# Patient Record
Sex: Female | Born: 1970 | Race: White | Hispanic: No | Marital: Married | Smoking: Never smoker
Health system: Southern US, Community
[De-identification: ages and names within clinical notes are randomized; demographics above are authoritative.]

## PROBLEM LIST (undated history)

## (undated) DIAGNOSIS — T7840XA Allergy, unspecified, initial encounter: Secondary | ICD-10-CM

## (undated) DIAGNOSIS — E079 Disorder of thyroid, unspecified: Secondary | ICD-10-CM

## (undated) DIAGNOSIS — Z9889 Other specified postprocedural states: Secondary | ICD-10-CM

## (undated) DIAGNOSIS — E785 Hyperlipidemia, unspecified: Secondary | ICD-10-CM

## (undated) DIAGNOSIS — D649 Anemia, unspecified: Secondary | ICD-10-CM

## (undated) DIAGNOSIS — E039 Hypothyroidism, unspecified: Secondary | ICD-10-CM

## (undated) DIAGNOSIS — Z5189 Encounter for other specified aftercare: Secondary | ICD-10-CM

## (undated) DIAGNOSIS — R112 Nausea with vomiting, unspecified: Secondary | ICD-10-CM

## (undated) HISTORY — DX: Encounter for other specified aftercare: Z51.89

## (undated) HISTORY — DX: Allergy, unspecified, initial encounter: T78.40XA

## (undated) HISTORY — DX: Hyperlipidemia, unspecified: E78.5

## (undated) HISTORY — DX: Disorder of thyroid, unspecified: E07.9

## (undated) HISTORY — DX: Anemia, unspecified: D64.9

## (undated) HISTORY — PX: MANDIBLE SURGERY: SHX707

---
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
## 2006-02-06 HISTORY — PX: TUBAL LIGATION: SHX77

## 2006-11-26 ENCOUNTER — Inpatient Hospital Stay (HOSPITAL_COMMUNITY): Admission: RE | Admit: 2006-11-26 | Discharge: 2006-11-29 | Payer: Self-pay | Admitting: Obstetrics and Gynecology

## 2006-11-26 ENCOUNTER — Encounter (INDEPENDENT_AMBULATORY_CARE_PROVIDER_SITE_OTHER): Payer: Self-pay | Admitting: Obstetrics and Gynecology

## 2006-12-04 ENCOUNTER — Ambulatory Visit: Admission: RE | Admit: 2006-12-04 | Discharge: 2006-12-04 | Payer: Self-pay | Admitting: Obstetrics and Gynecology

## 2008-01-07 ENCOUNTER — Encounter: Admission: RE | Admit: 2008-01-07 | Discharge: 2008-01-07 | Payer: Self-pay | Admitting: Family Medicine

## 2008-02-07 HISTORY — PX: OTHER SURGICAL HISTORY: SHX169

## 2009-03-30 ENCOUNTER — Ambulatory Visit (HOSPITAL_COMMUNITY): Admission: RE | Admit: 2009-03-30 | Discharge: 2009-03-30 | Payer: Self-pay | Admitting: Obstetrics and Gynecology

## 2010-04-27 LAB — URINALYSIS, ROUTINE W REFLEX MICROSCOPIC
Protein, ur: NEGATIVE mg/dL
Specific Gravity, Urine: <1.005 — ABNORMAL LOW (ref 1.005–1.030)
Urobilinogen, UA: 0.2 mg/dL (ref 0.0–1.0)

## 2010-04-27 LAB — CBC
HCT: 34.8 % — ABNORMAL LOW (ref 36.0–46.0)
MCV: 82.8 fL (ref 78.0–100.0)
Platelets: 151 10*3/uL (ref 150–400)
RBC: 4.21 MIL/uL (ref 3.87–5.11)
WBC: 5.5 10*3/uL (ref 4.0–10.5)

## 2010-04-27 LAB — TSH: TSH: 5.581 u[IU]/mL — ABNORMAL HIGH (ref 0.350–4.500)

## 2010-06-21 NOTE — Op Note (Signed)
NAMELAURALIE, BLACKSHER               ACCOUNT NO.:  [id number]   MEDICAL RECORD NO.:  [id number]          PATIENT TYPE:  INP   LOCATION:  9128                          FACILITY:  [facility]   PHYSICIAN:  Randye Lobo, M.D.   DATE OF BIRTH:  [birth year]   DATE OF PROCEDURE:  DATE OF DISCHARGE:                               OPERATIVE REPORT   PREOPERATIVE DIAGNOSES:  1. Intrauterine gestation at 38.5 weeks.  2. A complete placenta previa.  3. Desire for permanent sterilization.   POSTOPERATIVE DIAGNOSES:  1. Intrauterine gestation at 38.5 weeks.  2. A complete placenta previa.  3. Desire for permanent sterilization.   PROCEDURE:  Primary low segment transverse cesarean section, bilateral  tubal ligation by modified Pomeroy technique.   SURGEON:  Conley Simmonds, MD   ASSISTANT:  Lodema Hong, MD   ANESTHESIA:  Spinal.   IV FLUIDS:  4400 mL Ringer's lactate.   ESTIMATED BLOOD LOSS:  1200 mL.   URINE OUTPUT:  200 mL   COMPLICATIONS:  None.   INDICATIONS FOR PROCEDURE:  The patient is a 40 year old gravida 2, para  1-0-0-1 Caucasian female at 64.5 weeks' gestation who has a known  complete posterior placenta previa who now presents at term for a  scheduled cesarean section.  The patient's placenta previa has been  asymptomatic throughout her pregnancy.  The patient also requests  permanent sterilization at the time of her cesarean section.  A plan is  now made to proceed after risks, benefits, and alternatives are  reviewed.  The patient understands that there is a failure rate of the  tubal ligation of approximately 1 in 250 to 1 in 300 which may result in  either an intrauterine or an ectopic pregnancy.   FINDINGS:  A viable female was delivered at 1310 with Apgars of 9 at one  minute and 5 minutes.  The weight was 8 pounds 4 ounces.  The placenta  was noted to also be adherent to the anterior lower uterine segment.  There was a marginal insertion of a three-vessel cord.  The  uterus,  tubes and ovaries were unremarkable.   SPECIMENS:  A portion of the right and left fallopian tubes were sent to  pathology.  The placenta was also sent to pathology.   PROCEDURE:  The patient was reidentified in the preoperative hold area.  She did receive Ancef 1 gram IV for antibiotic prophylaxis.  In the  operating room, spinal anesthetic was administered.  The patient was  then placed in the supine position with a left lateral tilt.  The  abdomen was sterilely prepped and a Foley catheter was sterilely placed  inside the bladder.  She was then sterilely draped.   A Pfannenstiel incision was created sharply with a scalpel.  The  incision was carried down to the fascia using monopolar cautery for  hemostasis.  The fascia was then incised in the midline and the incision  was extended bilaterally with Mayo scissors.  The rectus muscles were  separated from the fascia superiorly and inferiorly.  The rectus muscles  were then sharply divided in  the midline.  The parietal peritoneum was  entered sharply with a Metzenbaum scissors after it was elevated with  two hemostat clamps.  The peritoneal incision was extended cranially and  caudally.   The lower uterine segment was exposed with the bladder retractor.  A  bladder flap was then sharply created.  A transverse lower uterine  segment incision was created with a scalpel.  The placenta was  encountered.  The uterine cavity was ultimately entered bluntly with a  Kelly clamp and the uterine incision was extended bluntly as well.  Membranes ere ruptured with an Allis clamp.  Fundal pressure was applied  and the head had to be rotated into a vertex presentation as it was  noted to be off to the patient's left-hand side.  A Mityvac was used to  assist with delivery of the vertex.  There was a poor seal which was  appreciated with the first applications and therefore a new vacuum was  called for and the Mityvac was applied one final  and third time which  resulted in good seal and then effected delivery of the vertex.  There  was a nuchal cord which was reduced.  The nares and mouth were then  suctioned and the remainder of the newborn was delivered without  difficulty.  The cord was doubly clamped and cut the newborn was carried  over to the awaiting pediatricians.   The placenta was manually extracted at this time and it was sent for  cord blood donation.  The uterus was exteriorized at this time and it  was wiped clean with a moistened lap pad.  The uterine incision was  closed with a double layer closure of #1 chromic.  The first was a  running locked layer and the second was an imbricating layer.  There was  in the area of bleeding near the right apex of the uterine incision and  this responded to a figure-of-eight suture of #1 chromic.  There was  also some bleeding along the left inferior lower uterine segment where  there was a small nonexpanding hematoma.  Initially two figure-of-eight  sutures of 2-0 chromic were placed but this did not create hemostasis.  A simple suture of #1 chromic was therefore placed which created good  hemostasis.   The right fallopian tube was grasped with a Babcock clamp and was  followed all the way to its fimbriated end.  A free tie of 0 plain gut  suture was then used to create a loop of tissue along the isthmic  portion of the fallopian tube.  A hemostat clamp was then brought  through the mesosalpinx and a separate tie of 2-0 plain gut suture was  placed at the base of each and at the loop of tissue.  The intervening  portion of fallopian tube was then sharply excised and sent to  pathology.  Hemostasis was good.  The same procedure that was performed  on the right fallopian tube was then repeated on the left fallopian tube  after it was grasped and followed all the way to its fimbriated end.   The uterus was returned to the peritoneal cavity which was irrigated and   suctioned at this time.  The uterine incision was noted to be  hemostatic.   The abdomen was closed.  The parietal peritoneum was closed with a  running suture of 3-0 Vicryl.  The rectus muscles were reapproximated in  the midline with interrupted sutures of #1 chromic.  The fascia was  closed with a running suture of 0 Vicryl.  The subcutaneous layer was  irrigated and suctioned and made hemostatic with monopolar cautery.  The  skin was closed with staples and a sterile bandage was placed over this.   This concluded the patient's procedure.  There were no complications.  All needle, instrument, sponge counts were correct.  The patient was  escorted to the recovery room in stable and awake condition.      Randye Lobo, M.D.  Electronically Signed     BES/MEDQ  D:  11/26/2006  T:  11/27/2006  Job:  [id number]

## 2010-06-21 NOTE — Discharge Summary (Signed)
Jacqueline Ochoa, Jacqueline Ochoa               ACCOUNT NO.:  [id number]   MEDICAL RECORD NO.:  [id number]          PATIENT TYPE:  INP   LOCATION:  9128                          FACILITY:  [facility]   PHYSICIAN:  Gerrit Friends. Aldona Bar, M.D.   DATE OF BIRTH:  1971-01-21   DATE OF ADMISSION:  11/26/2006  DATE OF DISCHARGE:  11/29/2006                               DISCHARGE SUMMARY   DISCHARGE DIAGNOSES:  1. 38-week intrauterine pregnancy, delivered.  8 pound 4 ounces female      infant, Apgars 9 and 10.  2. Blood type A positive.  3. Complete placenta previa.  4. Desire for elective sterilization.   PROCEDURE:  Primary low transverse cesarean section and tubal  sterilization.   INDICATIONS FOR PROCEDURE:  This 40 year old gravida 2, para 1 presented  for admission on November 26, 2006 for a primary low transverse cesarean  section because of a complete placenta previa.  The patient has been  asymptomatic during her pregnancy and had a relatively benign pregnancy.  She also is desirous of a permanent elective sterilization procedure.   DESCRIPTION OF PROCEDURE:  She was taken to the operating room on  November 26, 2006 at which time she underwent a primary low transverse  cesarean section with delivery of an 8 pound 4 ounces female infant with  Apgars of 9 and 10.  A tubal sterilization procedure was likewise  carried out.  Her postpartum course was complicated by anemia.  On  November 27, 2006, her hemoglobin was 7.8 with a white count of 14,000  and a platelet count of 125,000.  On November 28, 2006, her hemoglobin  was 10.4 with a white count of 12,500 and a platelet count of 135,000.  On the morning of November 29, 2006, she was ambulating well, tolerating  a regular diet well, and was afebrile.  The wound was clean and dry.  Her breast feeding was going well.  She was tolerating a regular diet  well, and she was desirous of discharge.  Accordingly, she was given all  appropriate instructions per discharge  brochure and understood all  instructions well.  Her staples were removed and wound was Steri-  Stripped with Benzoin.   DISCHARGE MEDICATIONS:  Her prescriptions include Motrin 600 mg every 6  hours as needed for cramping or pain and Tylox 1-2 every 4-6 hours as  needed for more severe pain.  She will also continue her vitamins one a  day and use Feosol capsules daily as well.   FOLLOW UP:  Followup in the office will be arranged 4 weeks hence.   A discharge brochure was given to the patient at the time of discharge  and understood well by the patient.  Followup, as mentioned, 4 weeks  hence.   CONDITION ON DISCHARGE:  Improved.      Gerrit Friends. Aldona Bar, M.D.  Electronically Signed     RMW/MEDQ  D:  11/29/2006  T:  11/29/2006  Job:  [id number]

## 2010-11-16 LAB — TYPE AND SCREEN
ABO/RH(D): A POS
Antibody Screen: NEGATIVE

## 2010-11-16 LAB — CBC
MCHC: 35.1
MCV: 89.1
MCV: 89.7
Platelets: 125 — ABNORMAL LOW
Platelets: 135 — ABNORMAL LOW
Platelets: 138 — ABNORMAL LOW
RBC: 2.51 — ABNORMAL LOW
RBC: 3.47 — ABNORMAL LOW
RDW: 15.1 — ABNORMAL HIGH
WBC: 12.5 — ABNORMAL HIGH
WBC: 8.3

## 2010-11-16 LAB — URINALYSIS, ROUTINE W REFLEX MICROSCOPIC
Nitrite: NEGATIVE
Specific Gravity, Urine: 1.01
Urobilinogen, UA: 0.2
pH: 7

## 2010-11-16 LAB — RPR: RPR Ser Ql: NONREACTIVE

## 2011-05-12 ENCOUNTER — Other Ambulatory Visit (HOSPITAL_BASED_OUTPATIENT_CLINIC_OR_DEPARTMENT_OTHER): Payer: Self-pay | Admitting: Family Medicine

## 2011-05-12 ENCOUNTER — Ambulatory Visit (HOSPITAL_BASED_OUTPATIENT_CLINIC_OR_DEPARTMENT_OTHER)
Admission: RE | Admit: 2011-05-12 | Discharge: 2011-05-12 | Disposition: A | Discharge status: Home / Self Care | Payer: 59 | Source: Ambulatory Visit | Attending: Family Medicine | Admitting: Family Medicine

## 2011-05-12 DIAGNOSIS — R1031 Right lower quadrant pain: Secondary | ICD-10-CM | POA: Insufficient documentation

## 2011-05-12 DIAGNOSIS — R932 Abnormal findings on diagnostic imaging of liver and biliary tract: Secondary | ICD-10-CM | POA: Insufficient documentation

## 2011-05-12 MED ORDER — IOHEXOL 300 MG/ML  SOLN
100.0000 mL | Freq: Once | INTRAMUSCULAR | Status: AC | PRN
  Administered 2011-05-12: 100 mL via INTRAVENOUS

## 2011-05-22 ENCOUNTER — Encounter (INDEPENDENT_AMBULATORY_CARE_PROVIDER_SITE_OTHER): Payer: Self-pay | Admitting: Surgery

## 2011-06-13 ENCOUNTER — Ambulatory Visit (INDEPENDENT_AMBULATORY_CARE_PROVIDER_SITE_OTHER): Payer: 59 | Admitting: Surgery

## 2011-06-13 ENCOUNTER — Encounter (INDEPENDENT_AMBULATORY_CARE_PROVIDER_SITE_OTHER): Payer: Self-pay | Admitting: Surgery

## 2011-06-13 VITALS — BP 114/62 | HR 68 | Temp 99.1°F | Resp 14 | Ht 66.0 in | Wt 120.2 lb

## 2011-06-13 DIAGNOSIS — K5900 Constipation, unspecified: Secondary | ICD-10-CM | POA: Insufficient documentation

## 2011-06-13 DIAGNOSIS — R1031 Right lower quadrant pain: Secondary | ICD-10-CM

## 2011-06-13 NOTE — Progress Notes (Signed)
Patient ID: Jacqueline Ochoa, female   DOB: October 01, 1970, 41 y.o.   MRN: [id number]  Chief Complaint  Patient presents with  . Inguinal Hernia    new pt- eval RIH    HPI Jacqueline Ochoa is a 41 y.o. female.  Referred by Dr. Foy Guadalajara for possible right inguinal hernia HPI This is a 41 yo female in good health who presents with a 9 month history of vague right lower quadrant abdominal pain near her right iliac crest.  Initially, this pain radiated down into her upper thigh and around her back.  In November, she had at least one severe episode of pain associated with nausea and vomiting.  The pain at this time was localized to the RLQ.  This resolved after about a day, but recurred a few weeks ago.  She was on a long driving trip to [place] and had several fast-food meals that day. She had severe pain again at the same place associated with nausea and vomiting.  She reports daily bowel movements, but they are frequently hard and require significant straining.  She had a CT scan on 05/12/11 that showed a 7 mm gallstone with no signs of cholecystitis, normal CBD.  No hernias were identified.  The appendix was normal.  She also underwent GYN examination which was unremarkable.  She is now referred for surgical evaluation. Past Medical History  Diagnosis Date  . Hyperlipidemia   . Thyroid disease   . Vitamin d deficiency   . Allergy   . Anemia   . Blood transfusion 1984    Past Surgical History  Procedure Date  . Tonsillectomy and adenoidectomy 1984  . Cesarean section 2008  . Tubal ligation 2008  . Uterine ablation 2010    Family History  Problem Relation Age of Onset  . Heart disease Maternal Grandfather     Social History History  Substance Use Topics  . Smoking status: Never Smoker   . Smokeless tobacco: Not on file  . Alcohol Use: Yes     occ    Allergies  Allergen Reactions  . Zocor (Simvastatin - High Dose)     Current Outpatient Prescriptions  Medication Sig Dispense Refill    . Cholecalciferol (VITAMIN D PO) Take 80,000 mg by mouth once a week.      . fish oil-omega-3 fatty acids 1000 MG capsule Take 2 g by mouth daily.      Marland Kitchen ketotifen (ALAWAY) 0.025 % ophthalmic solution 1 drop 2 (two) times daily.      Marland Kitchen levothyroxine (SYNTHROID) 88 MCG tablet Take 88 mcg by mouth daily.      Marland Kitchen liothyronine (CYTOMEL) 5 MCG tablet Take 5 mcg by mouth 2 (two) times daily.       . mometasone (NASONEX) 50 MCG/ACT nasal spray Place 2 sprays into the nose as needed.       . Multiple Vitamins-Minerals (MULTIVITAMIN WITH MINERALS) tablet Take 1 tablet by mouth daily.      . Probiotic Product (PROBIOTIC FORMULA PO) Take by mouth daily. W/ lactose defense        Review of Systems Review of Systems  Constitutional: Positive for appetite change. Negative for fever, chills and unexpected weight change.  HENT: Negative for hearing loss, congestion, sore throat, trouble swallowing and voice change.   Eyes: Negative for visual disturbance.  Respiratory: Negative for cough and wheezing.   Cardiovascular: Negative for chest pain, palpitations and leg swelling.  Gastrointestinal: Positive for nausea, vomiting, abdominal pain and constipation.  Negative for diarrhea, blood in stool, abdominal distention and anal bleeding.  Genitourinary: Negative for hematuria, vaginal bleeding and difficulty urinating.  Musculoskeletal: Negative for arthralgias.  Skin: Negative for rash and wound.  Neurological: Negative for seizures, syncope and headaches.  Hematological: Negative for adenopathy. Does not bruise/bleed easily.  Psychiatric/Behavioral: Negative for confusion.    Blood pressure 114/62, pulse 68, temperature 99.1 F (37.3 C), temperature source Temporal, resp. rate 14, height 5\' 6"  (1.676 m), weight 120 lb 3.2 oz (54.522 kg).  Physical Exam Physical Exam WDWN in NAD - thin HEENT:  EOMI, sclera anicteric Neck:  No masses, no thyromegaly Lungs:  CTA bilaterally; normal respiratory  effort CV:  Regular rate and rhythm; no murmurs Abd:  +bowel sounds, soft, mild tenderness just medial to her right ASIS; Liver edge is palpable just above iliac crest Ext:  Well-perfused; no edema Skin:  Warm, dry; no sign of jaundice  Data Reviewed CT scan - abd/pelvis  My interpretation notices a large amount of formed stool in the cecum and ascending colon.  No sign of any hernias.    Official report  CT Abdomen Pelvis W Contrast   Status: Final result       PACS Images     Show images for CT Abdomen Pelvis W Contrast      Study Result     *RADIOLOGY REPORT*   Clinical Data: Recurrent right lower quadrant pain   CT ABDOMEN AND PELVIS WITH CONTRAST   Technique:  Multidetector CT imaging of the abdomen and pelvis was performed following the standard protocol during bolus administration of intravenous contrast.   Contrast: OMNIPAQUE IOHEXOL 300 MG/ML  SOLN   Comparison: None   Findings: The lung bases are clear.   No focal liver abnormalities identified.   Normal appearance of the spleen.   The adrenal glands are normal.   Pancreas is negative.   Normal appearance of the right kidney.  The left kidney is also normal.   Larina Bras is identified within the lumen of the gallbladder measuring up to 7 mm.   No secondary signs of acute cholecystitis.  The common bile duct has a normal caliber.  The pancreas is normal.   Normal appearance of the right kidney.  The left kidney is also normal.   There is no upper abdominal adenopathy identified.   There is no pelvic or inguinal adenopathy.  The uterus and the adnexal structures have a normal physiologic appearance.   The urinary bladder is also normal in appearance   The small bowel loops are unremarkable.  The appendix is identified and is within normal limits.  The colon is normal.   Review of the visualized osseous structures is unremarkable. The stomach appears normal.   IMPRESSION:   1.  No  acute findings within the abdomen or pelvis. 2.  The appendix is visualized and appears normal. 3.  Suspect gallstone.   Original Report Authenticated By: Rosealee Albee, M.D.      Assessment   1.  RLQ pain of unclear etiology - doubt GYN etiology 2.  Cholelithiasis - her liver is low-riding and near her iliac crest, so it is possible this pain is coming from her gallbladder.  The nausea/ vomiting and the relationship to fast food makes this suspicious. 3.  Constipation - significant distention of cecum and ascending noted on CT scan 4.  Chronic appendicitis - much less likely    Plan    Bowel regimen - BID stool softener/ PRN  miralax until she feels that her colon is empty.  Keep well-hydrated Observe diet for any relationship to her symptoms. HIDA scan is a possible future test to check for biliary dyskinesia with exacerbation of her symptoms Reevaluate in 3 weeks.       Zygmunt Mcglinn K. 06/13/2011, 10:32 PM

## 2011-06-13 NOTE — Patient Instructions (Signed)
Stool softener twice daily Use Miralax as needed to have large bowel movement to clear colon. Keep track to see if certain types of food make your abdomen hurt  Follow-up 3 weeks

## 2011-07-05 ENCOUNTER — Ambulatory Visit (INDEPENDENT_AMBULATORY_CARE_PROVIDER_SITE_OTHER): Payer: 59 | Admitting: Surgery

## 2011-07-05 ENCOUNTER — Other Ambulatory Visit (INDEPENDENT_AMBULATORY_CARE_PROVIDER_SITE_OTHER): Payer: Self-pay | Admitting: Surgery

## 2011-07-05 ENCOUNTER — Encounter (INDEPENDENT_AMBULATORY_CARE_PROVIDER_SITE_OTHER): Payer: Self-pay | Admitting: Surgery

## 2011-07-05 VITALS — BP 111/62 | HR 82 | Temp 98.6°F | Resp 12 | Ht 66.0 in | Wt 119.0 lb

## 2011-07-05 DIAGNOSIS — R1031 Right lower quadrant pain: Secondary | ICD-10-CM

## 2011-07-05 NOTE — Progress Notes (Signed)
The patient has been using PRN Dulcolax to help with her bowel movements.  She has not been having any lower abdominal pain.  She also describes another type of pain that occurs in her groin and radiates around to her buttock and down her leg.  This pain has not been present over the last few weeks.  She is quite concerned that she will have another "attack" or episode of pain.  She has been eating some fat in her diet, but not a high-fat diet.    In the past, when she had these two severe episodes, she reported nausea, vomiting, and low-grade fever with the severe RLQ pain.  She also had some bloating.  This has not been present recently.  Filed Vitals:   07/05/11 1550  BP: 111/62  Pulse: 82  Temp: 98.6 F (37 C)  Resp: 12    Abd - soft, non-tender; no palpable masses, active bowel sounds R groin - no palpable hernia with Valsalva while standing.  Imp:  Intermittent severe RLQ pain - possible gallbladder disease Known tiny cholelithiasis No sign of inguinal hernia  Plan:  HIDA scan to evaluate gallbladder ejection fraction and to look for symptoms after CCK.    Will reevaluate the patient after her HIDA-EF  Wilmon Arms. Corliss Skains, MD, [facility]  07/05/2011 4:26 PM

## 2011-07-12 ENCOUNTER — Other Ambulatory Visit (HOSPITAL_COMMUNITY): Payer: 59

## 2011-07-13 ENCOUNTER — Encounter (HOSPITAL_COMMUNITY)
Admission: RE | Admit: 2011-07-13 | Discharge: 2011-07-13 | Disposition: A | Discharge status: Home / Self Care | Payer: 59 | Source: Ambulatory Visit | Attending: Surgery | Admitting: Surgery

## 2011-07-13 DIAGNOSIS — R1031 Right lower quadrant pain: Secondary | ICD-10-CM | POA: Insufficient documentation

## 2011-07-13 MED ORDER — SINCALIDE 5 MCG IJ SOLR
INTRAMUSCULAR | Status: AC
  Administered 2011-07-13: 1.05 ug
  Filled 2011-07-13: qty 5

## 2011-07-13 MED ORDER — TECHNETIUM TC 99M MEBROFENIN IV KIT
5.0000 | PACK | Freq: Once | INTRAVENOUS | Status: AC | PRN
  Administered 2011-07-13: 5 via INTRAVENOUS

## 2011-07-18 ENCOUNTER — Telehealth (INDEPENDENT_AMBULATORY_CARE_PROVIDER_SITE_OTHER): Payer: Self-pay | Admitting: General Surgery

## 2011-07-18 NOTE — Telephone Encounter (Signed)
Pt calling for HIDA scan results.  Normal results of scan related to her.

## 2011-07-28 ENCOUNTER — Ambulatory Visit (INDEPENDENT_AMBULATORY_CARE_PROVIDER_SITE_OTHER): Payer: 59 | Admitting: Surgery

## 2011-07-28 ENCOUNTER — Encounter (INDEPENDENT_AMBULATORY_CARE_PROVIDER_SITE_OTHER): Payer: Self-pay | Admitting: Surgery

## 2011-07-28 VITALS — BP 102/62 | HR 87 | Temp 98.8°F | Resp 16 | Ht 66.0 in | Wt 116.8 lb

## 2011-07-28 DIAGNOSIS — R1031 Right lower quadrant pain: Secondary | ICD-10-CM | POA: Insufficient documentation

## 2011-07-29 ENCOUNTER — Encounter (INDEPENDENT_AMBULATORY_CARE_PROVIDER_SITE_OTHER): Payer: Self-pay | Admitting: Surgery

## 2011-07-29 NOTE — Progress Notes (Signed)
The HIDA scan was negative with a normal GB EF.  She had an episode yesterday where she developed severe right groin pain, associated with radiation down her thigh and into her buttock.  She also had significant nausea with this episode.  She does note that her menstrual period began yesterday.  One of the previous severe episodes in April also began on the first day of her period.    Filed Vitals:   07/28/11 1527  BP: 102/62  Pulse: 87  Temp: 98.8 F (37.1 C)  Resp: 16   Abdomen is soft, non-tender. No palpable masses. Right groin - no palpable hernia, but she is tender in the middle of her right groin.  No significant lymphadenopathy FROM of the right leg.  Imp:  Severe right groin pain of unclear etiology  Despite the presence of gallstone, no sign of GB symptoms.  Appendix is normal on CT scan.  Possible sports hernia or other musculoskeletal etiology.  GYN etiology is also possible with the relationship to her periods, but this is less likely.    Plan:  I will forward a copy of this note to her GYN to get their opinion.  We will also obtain a pelvic MRI to hopefully better define the etiology of this pain.  This is a puzzling case.  We will call the patient when the MRI results have returned.  Wilmon Arms. Corliss Skains, MD, [facility] Surgery  07/29/2011 1:05 PM

## 2011-08-03 ENCOUNTER — Ambulatory Visit
Admission: RE | Admit: 2011-08-03 | Discharge: 2011-08-03 | Disposition: A | Discharge status: Home / Self Care | Payer: 59 | Source: Ambulatory Visit | Attending: Surgery | Admitting: Surgery

## 2011-08-03 DIAGNOSIS — R1031 Right lower quadrant pain: Secondary | ICD-10-CM

## 2011-08-03 MED ORDER — GADOBENATE DIMEGLUMINE 529 MG/ML IV SOLN
10.0000 mL | Freq: Once | INTRAVENOUS | Status: AC | PRN
  Administered 2011-08-03: 10 mL via INTRAVENOUS

## 2011-08-03 NOTE — Progress Notes (Signed)
Quick Note:  Please fax a copy of the MRI report to the patient's GYN. Also, let the patient know that the MRI showed a possible endometrioma in front of her right ovary.  She needs to see her GYN about this and does not necessarily need to come back to see me. There does not seem to be a surgical problem that I can help with.   ______

## 2013-03-07 ENCOUNTER — Other Ambulatory Visit: Payer: Self-pay

## 2013-06-07 ENCOUNTER — Emergency Department (HOSPITAL_COMMUNITY): Payer: BC Managed Care – PPO

## 2013-06-07 ENCOUNTER — Encounter (HOSPITAL_COMMUNITY): Payer: Self-pay | Admitting: Emergency Medicine

## 2013-06-07 ENCOUNTER — Emergency Department (HOSPITAL_COMMUNITY)
Admission: EM | Admit: 2013-06-07 | Discharge: 2013-06-07 | Disposition: A | Discharge status: Home / Self Care | Payer: BC Managed Care – PPO | Attending: Emergency Medicine | Admitting: Emergency Medicine

## 2013-06-07 DIAGNOSIS — R209 Unspecified disturbances of skin sensation: Secondary | ICD-10-CM | POA: Insufficient documentation

## 2013-06-07 DIAGNOSIS — E785 Hyperlipidemia, unspecified: Secondary | ICD-10-CM | POA: Insufficient documentation

## 2013-06-07 DIAGNOSIS — R079 Chest pain, unspecified: Secondary | ICD-10-CM

## 2013-06-07 DIAGNOSIS — IMO0002 Reserved for concepts with insufficient information to code with codable children: Secondary | ICD-10-CM | POA: Insufficient documentation

## 2013-06-07 DIAGNOSIS — R110 Nausea: Secondary | ICD-10-CM | POA: Insufficient documentation

## 2013-06-07 DIAGNOSIS — Z862 Personal history of diseases of the blood and blood-forming organs and certain disorders involving the immune mechanism: Secondary | ICD-10-CM | POA: Insufficient documentation

## 2013-06-07 DIAGNOSIS — E559 Vitamin D deficiency, unspecified: Secondary | ICD-10-CM | POA: Insufficient documentation

## 2013-06-07 DIAGNOSIS — R1013 Epigastric pain: Secondary | ICD-10-CM | POA: Insufficient documentation

## 2013-06-07 DIAGNOSIS — Z79899 Other long term (current) drug therapy: Secondary | ICD-10-CM | POA: Insufficient documentation

## 2013-06-07 DIAGNOSIS — E039 Hypothyroidism, unspecified: Secondary | ICD-10-CM | POA: Insufficient documentation

## 2013-06-07 LAB — CBC
HEMATOCRIT: 39.5 % (ref 36.0–46.0)
Hemoglobin: 13.3 g/dL (ref 12.0–15.0)
MCH: 29.3 pg (ref 26.0–34.0)
MCHC: 33.7 g/dL (ref 30.0–36.0)
MCV: 87 fL (ref 78.0–100.0)
PLATELETS: 209 10*3/uL (ref 150–400)
RBC: 4.54 MIL/uL (ref 3.87–5.11)
RDW: 13.2 % (ref 11.5–15.5)
WBC: 5.9 10*3/uL (ref 4.0–10.5)

## 2013-06-07 LAB — I-STAT TROPONIN, ED
TROPONIN I, POC: 0 ng/mL (ref 0.00–0.08)
Troponin i, poc: 0 ng/mL (ref 0.00–0.08)

## 2013-06-07 LAB — LIPASE, BLOOD: Lipase: 39 U/L (ref 11–59)

## 2013-06-07 LAB — COMPREHENSIVE METABOLIC PANEL
ALT: 19 U/L (ref 0–35)
AST: 22 U/L (ref 0–37)
Albumin: 4.4 g/dL (ref 3.5–5.2)
Alkaline Phosphatase: 57 U/L (ref 39–117)
BUN: 16 mg/dL (ref 6–23)
CO2: 26 meq/L (ref 19–32)
CREATININE: 0.71 mg/dL (ref 0.50–1.10)
Calcium: 9.5 mg/dL (ref 8.4–10.5)
Chloride: 99 mEq/L (ref 96–112)
GFR calc non Af Amer: >90 mL/min (ref >90)
Glucose, Bld: 88 mg/dL (ref 70–99)
Potassium: 4 mEq/L (ref 3.7–5.3)
Sodium: 137 mEq/L (ref 137–147)
TOTAL PROTEIN: 8.3 g/dL (ref 6.0–8.3)
Total Bilirubin: 1 mg/dL (ref 0.3–1.2)

## 2013-06-07 MED ORDER — ASPIRIN 81 MG PO CHEW
324.0000 mg | CHEWABLE_TABLET | Freq: Once | ORAL | Status: AC
  Administered 2013-06-07: 324 mg via ORAL
  Filled 2013-06-07: qty 4

## 2013-06-07 NOTE — ED Provider Notes (Signed)
CSN: [id number]     Arrival date & time 06/07/13  1026 History   First MD Initiated Contact with Patient 06/07/13 1109     Chief Complaint  Patient presents with  . Chest Pain     (Consider location/radiation/quality/duration/timing/severity/associated sxs/prior Treatment) HPI Jacqueline Ochoa is a 43 y.o. female emergency department complaining of chest pain and left arm numbness and tingling. Patient states that she was in the car this morning and had 3 pickups, states shortly after that she developed pressure in epigastric area and lower chest. States this lasted several minutes and then she developed left arm and hand tingling and numbness sensation. States it wasn't all of her fingers. States at that point her chest pressure resolved. States it began again a few minutes later, and stating again for several minutes. He had associated nausea, denies any shortness of breath, dizziness. She states she came to emergency department. Here, her left arm numbness and tingling has began to improve. She states she's currently symptom-free. She denies any prior exertional symptoms. She states she had similar episode without the arm numbness a year ago and she was seen by her primary care Dr. who did an EKG and was told this could be anxiety. Patient states she has been under a lot of stress this week. States she took Aleve this morning she did not take any other medications. She denies any recent travel or surgeries. She denies any cough. No fever, chills. No abdominal pain. No vomiting or diarrhea.  Past Medical History  Diagnosis Date  . Hyperlipidemia   . Thyroid disease   . Vitamin D deficiency   . Allergy   . Anemia   . Blood transfusion 1984   Past Surgical History  Procedure Laterality Date  . Tonsillectomy and adenoidectomy  1984  . Cesarean section  2008  . Tubal ligation  2008  . Uterine ablation  2010   Family History  Problem Relation Age of Onset  . Heart disease Maternal  Grandfather    History  Substance Use Topics  . Smoking status: Never Smoker   . Smokeless tobacco: Not on file  . Alcohol Use: Yes     Comment: occ   OB History   Grav Para Term Preterm Abortions TAB SAB Ect Mult Living                 Review of Systems  Constitutional: Negative for fever and chills.  Respiratory: Positive for chest tightness. Negative for cough and shortness of breath.   Cardiovascular: Positive for chest pain. Negative for palpitations and leg swelling.  Gastrointestinal: Positive for nausea. Negative for vomiting, abdominal pain and diarrhea.  Genitourinary: Negative for dysuria, flank pain and pelvic pain.  Musculoskeletal: Negative for arthralgias, myalgias, neck pain and neck stiffness.  Skin: Negative for rash.  Neurological: Negative for dizziness, weakness and headaches.  All other systems reviewed and are negative.     Allergies  Zocor  Home Medications   Prior to Admission medications   Medication Sig Start Date End Date Taking? Authorizing Provider  Cholecalciferol (VITAMIN D PO) Take 80,000 mg by mouth once a week.    Historical Provider, MD  fish oil-omega-3 fatty acids 1000 MG capsule Take 2 g by mouth daily.    Historical Provider, MD  ketotifen (ALAWAY) 0.025 % ophthalmic solution 1 drop 2 (two) times daily.    Historical Provider, MD  levothyroxine (SYNTHROID) 88 MCG tablet Take 88 mcg by mouth daily.    Historical  Provider, MD  liothyronine (CYTOMEL) 5 MCG tablet Take 5 mcg by mouth 2 (two) times daily.     Historical Provider, MD  mometasone (NASONEX) 50 MCG/ACT nasal spray Place 2 sprays into the nose as needed.     Historical Provider, MD  Multiple Vitamins-Minerals (MULTIVITAMIN WITH MINERALS) tablet Take 1 tablet by mouth daily.    Historical Provider, MD  Probiotic Product (PROBIOTIC FORMULA PO) Take by mouth daily. W/ lactose defense    Historical Provider, MD   BP 112/75  Pulse 91  Temp(Src) 98 F (36.7 C) (Oral)  Resp 19   Ht 5\' 6"  (1.676 m)  Wt 125 lb (56.7 kg)  BMI 20.19 kg/m2  SpO2 100%  LMP 05/24/2013 Physical Exam  Nursing note and vitals reviewed. Constitutional: She appears well-developed and well-nourished. No distress.  HENT:  Head: Normocephalic.  Eyes: Conjunctivae are normal.  Neck: Neck supple.  Cardiovascular: Normal rate, regular rhythm and normal heart sounds.   Pulmonary/Chest: Effort normal and breath sounds normal. No respiratory distress. She has no wheezes. She has no rales. She exhibits no tenderness.  Abdominal: Soft. Bowel sounds are normal. She exhibits no distension. There is no tenderness. There is no rebound.  Musculoskeletal: She exhibits no edema.  Neurological: She is alert.  Skin: Skin is warm and dry.  Psychiatric: She has a normal mood and affect. Her behavior is normal.    ED Course  Procedures (including critical care time) Labs Review Labs Reviewed  CBC  COMPREHENSIVE METABOLIC PANEL  LIPASE, BLOOD  I-STAT TROPOININ, ED  I-STAT TROPOININ, ED    Imaging Review Dg Chest 2 View (if Patient Has Fever And/or Copd)  06/07/2013   CLINICAL DATA:  Chest pain and tightness  EXAM: CHEST  2 VIEW  COMPARISON:  None  FINDINGS: The heart size and mediastinal contours are within normal limits. Both lungs are clear. The visualized skeletal structures are unremarkable.  IMPRESSION: No active cardiopulmonary disease.   Electronically Signed   By: Kerby Moors M.D.   On: 06/07/2013 11:55     EKG Interpretation   Date/Time:  Saturday Jun 07 2013 10:30:04 EDT Ventricular Rate:  86 PR Interval:  164 QRS Duration: 78 QT Interval:  382 QTC Calculation: 457 R Axis:   61 Text Interpretation:  Normal sinus rhythm Cannot rule out Anterior infarct  , age undetermined Abnormal ECG Confirmed by RAY MD, Andee Poles [phone]) on  06/07/2013 12:33:53 PM      MDM   Final diagnoses:  Chest pain    Pt with chest pain now resolved, after she had 3 hick ups. Some pain in epigstric  area. DDx includes ACS, esophageal spasms, GERD. She is PERC negative. Doubt PE. Pt did have non specific tingling and numbness sensation in left arm, which also now resolved. Pt states she has hx of anxiety and has been under some stress. Will check labs, cxr, monitor.    4:10 PM 6 hr trop rechecked and is 0. Pt is symptom free. Only risk factor for ACS is hyperlipidimia. Pt is closely followed by PCP. At this time, stable for discharge home with close outpatient follow up. Advised to take baby aspirin daily until re evaluated. Pt voiced understanding.   Filed Vitals:   06/07/13 1452 06/07/13 1500 06/07/13 1530 06/07/13 1600  BP: 94/64 96/64 98/63  94/57  Pulse:  87 81 77  Temp:      TempSrc:      Resp: 17 17 18 20   Height:  Weight:      SpO2: 97% 98% 99% 98%     Renold Genta, PA-C 06/07/13 1757

## 2013-06-07 NOTE — Discharge Instructions (Signed)
Take 81mg  aspirin daily. Today all labs and chest x-ray are normal. Please follow up with your doctor next week for close recheck. Return if worsening symptoms.     Chest Pain (Nonspecific) It is often hard to give a specific diagnosis for the cause of chest pain. There is always a chance that your pain could be related to something serious, such as a heart attack or a blood clot in the lungs. You need to follow up with your caregiver for further evaluation. CAUSES   Heartburn.  Pneumonia or bronchitis.  Anxiety or stress.  Inflammation around your heart (pericarditis) or lung (pleuritis or pleurisy).  A blood clot in the lung.  A collapsed lung (pneumothorax). It can develop suddenly on its own (spontaneous pneumothorax) or from injury (trauma) to the chest.  Shingles infection (herpes zoster virus). The chest wall is composed of bones, muscles, and cartilage. Any of these can be the source of the pain.  The bones can be bruised by injury.  The muscles or cartilage can be strained by coughing or overwork.  The cartilage can be affected by inflammation and become sore (costochondritis). DIAGNOSIS  Lab tests or other studies, such as X-rays, electrocardiography, stress testing, or cardiac imaging, may be needed to find the cause of your pain.  TREATMENT   Treatment depends on what may be causing your chest pain. Treatment may include:  Acid blockers for heartburn.  Anti-inflammatory medicine.  Pain medicine for inflammatory conditions.  Antibiotics if an infection is present.  You may be advised to change lifestyle habits. This includes stopping smoking and avoiding alcohol, caffeine, and chocolate.  You may be advised to keep your head raised (elevated) when sleeping. This reduces the chance of acid going backward from your stomach into your esophagus.  Most of the time, nonspecific chest pain will improve within 2 to 3 days with rest and mild pain medicine. HOME CARE  INSTRUCTIONS   If antibiotics were prescribed, take your antibiotics as directed. Finish them even if you start to feel better.  For the next few days, avoid physical activities that bring on chest pain. Continue physical activities as directed.  Do not smoke.  Avoid drinking alcohol.  Only take over-the-counter or prescription medicine for pain, discomfort, or fever as directed by your caregiver.  Follow your caregiver's suggestions for further testing if your chest pain does not go away.  Keep any follow-up appointments you made. If you do not go to an appointment, you could develop lasting (chronic) problems with pain. If there is any problem keeping an appointment, you must call to reschedule. SEEK MEDICAL CARE IF:   You think you are having problems from the medicine you are taking. Read your medicine instructions carefully.  Your chest pain does not go away, even after treatment.  You develop a rash with blisters on your chest. SEEK IMMEDIATE MEDICAL CARE IF:   You have increased chest pain or pain that spreads to your arm, neck, jaw, back, or abdomen.  You develop shortness of breath, an increasing cough, or you are coughing up blood.  You have severe back or abdominal pain, feel nauseous, or vomit.  You develop severe weakness, fainting, or chills.  You have a fever. THIS IS AN EMERGENCY. Do not wait to see if the pain will go away. Get medical help at once. Call your local emergency services (911 in [place] Do not drive yourself to the hospital. MAKE SURE YOU:   Understand these instructions.  Will watch  your condition.  Will get help right away if you are not doing well or get worse. Document Released: 11/02/2004 Document Revised: 04/17/2011 Document Reviewed: 08/29/2007 [facility] Patient Information 2014 [place].

## 2013-06-07 NOTE — ED Provider Notes (Signed)
History/physical exam/procedure(s) were performed by non-physician practitioner and as supervising physician I was immediately available for consultation/collaboration. I have reviewed all notes and am in agreement with care and plan.   Shaune Pollack, MD 06/07/13 [phone]

## 2013-06-07 NOTE — ED Notes (Signed)
Pt c/o epigastric pain onset while riding in the car. Pt reports that she had a couple of hick ups that made the tightness worse and brought on shortness of breath and nausea. Pt reports that she had palpations and pain down left arm

## 2016-01-02 DIAGNOSIS — Z23 Encounter for immunization: Secondary | ICD-10-CM | POA: Diagnosis not present

## 2016-06-09 DIAGNOSIS — J301 Allergic rhinitis due to pollen: Secondary | ICD-10-CM | POA: Diagnosis not present

## 2016-06-09 DIAGNOSIS — E039 Hypothyroidism, unspecified: Secondary | ICD-10-CM | POA: Diagnosis not present

## 2016-06-09 DIAGNOSIS — E559 Vitamin D deficiency, unspecified: Secondary | ICD-10-CM | POA: Diagnosis not present

## 2016-06-09 DIAGNOSIS — E7800 Pure hypercholesterolemia, unspecified: Secondary | ICD-10-CM | POA: Diagnosis not present

## 2016-06-15 DIAGNOSIS — M2604 Mandibular hypoplasia: Secondary | ICD-10-CM | POA: Diagnosis not present

## 2016-06-15 DIAGNOSIS — M264 Malocclusion, unspecified: Secondary | ICD-10-CM | POA: Diagnosis not present

## 2016-06-15 DIAGNOSIS — M26633 Articular disc disorder of bilateral temporomandibular joint: Secondary | ICD-10-CM | POA: Diagnosis not present

## 2016-10-16 DIAGNOSIS — Z681 Body mass index (BMI) 19 or less, adult: Secondary | ICD-10-CM | POA: Diagnosis not present

## 2016-10-16 DIAGNOSIS — N63 Unspecified lump in unspecified breast: Secondary | ICD-10-CM | POA: Diagnosis not present

## 2016-10-24 ENCOUNTER — Other Ambulatory Visit: Payer: Self-pay | Admitting: Obstetrics and Gynecology

## 2016-10-25 ENCOUNTER — Other Ambulatory Visit: Payer: Self-pay | Admitting: Obstetrics and Gynecology

## 2016-10-25 DIAGNOSIS — N63 Unspecified lump in unspecified breast: Secondary | ICD-10-CM

## 2016-10-27 ENCOUNTER — Other Ambulatory Visit: Payer: Self-pay | Admitting: Obstetrics and Gynecology

## 2016-10-27 DIAGNOSIS — N63 Unspecified lump in unspecified breast: Secondary | ICD-10-CM

## 2016-11-01 ENCOUNTER — Ambulatory Visit
Admission: RE | Admit: 2016-11-01 | Discharge: 2016-11-01 | Disposition: A | Discharge status: Home / Self Care | Payer: BLUE CROSS/BLUE SHIELD | Source: Ambulatory Visit | Attending: Obstetrics and Gynecology | Admitting: Obstetrics and Gynecology

## 2016-11-01 ENCOUNTER — Other Ambulatory Visit: Payer: Self-pay | Admitting: Obstetrics and Gynecology

## 2016-11-01 DIAGNOSIS — N63 Unspecified lump in unspecified breast: Secondary | ICD-10-CM

## 2016-11-01 DIAGNOSIS — N6310 Unspecified lump in the right breast, unspecified quadrant: Secondary | ICD-10-CM | POA: Diagnosis not present

## 2016-11-01 DIAGNOSIS — N6001 Solitary cyst of right breast: Secondary | ICD-10-CM

## 2016-11-01 DIAGNOSIS — R922 Inconclusive mammogram: Secondary | ICD-10-CM | POA: Diagnosis not present

## 2016-11-03 DIAGNOSIS — Z681 Body mass index (BMI) 19 or less, adult: Secondary | ICD-10-CM | POA: Diagnosis not present

## 2016-11-03 DIAGNOSIS — Z01419 Encounter for gynecological examination (general) (routine) without abnormal findings: Secondary | ICD-10-CM | POA: Diagnosis not present

## 2016-11-09 ENCOUNTER — Ambulatory Visit
Admission: RE | Admit: 2016-11-09 | Discharge: 2016-11-09 | Disposition: A | Discharge status: Home / Self Care | Payer: BLUE CROSS/BLUE SHIELD | Source: Ambulatory Visit | Attending: Obstetrics and Gynecology | Admitting: Obstetrics and Gynecology

## 2016-11-09 DIAGNOSIS — N6001 Solitary cyst of right breast: Secondary | ICD-10-CM

## 2017-10-09 DIAGNOSIS — Z0000 Encounter for general adult medical examination without abnormal findings: Secondary | ICD-10-CM | POA: Diagnosis not present

## 2017-10-09 DIAGNOSIS — E039 Hypothyroidism, unspecified: Secondary | ICD-10-CM | POA: Diagnosis not present

## 2017-10-09 DIAGNOSIS — E559 Vitamin D deficiency, unspecified: Secondary | ICD-10-CM | POA: Diagnosis not present

## 2017-10-09 DIAGNOSIS — R198 Other specified symptoms and signs involving the digestive system and abdomen: Secondary | ICD-10-CM | POA: Diagnosis not present

## 2017-10-09 DIAGNOSIS — D7589 Other specified diseases of blood and blood-forming organs: Secondary | ICD-10-CM | POA: Diagnosis not present

## 2017-10-09 DIAGNOSIS — E7800 Pure hypercholesterolemia, unspecified: Secondary | ICD-10-CM | POA: Diagnosis not present

## 2017-10-31 DIAGNOSIS — E538 Deficiency of other specified B group vitamins: Secondary | ICD-10-CM | POA: Diagnosis not present

## 2017-11-15 DIAGNOSIS — E538 Deficiency of other specified B group vitamins: Secondary | ICD-10-CM | POA: Diagnosis not present

## 2017-11-28 DIAGNOSIS — E538 Deficiency of other specified B group vitamins: Secondary | ICD-10-CM | POA: Diagnosis not present

## 2017-12-12 DIAGNOSIS — E538 Deficiency of other specified B group vitamins: Secondary | ICD-10-CM | POA: Diagnosis not present

## 2017-12-25 DIAGNOSIS — E538 Deficiency of other specified B group vitamins: Secondary | ICD-10-CM | POA: Diagnosis not present

## 2018-01-26 DIAGNOSIS — Z23 Encounter for immunization: Secondary | ICD-10-CM | POA: Diagnosis not present

## 2018-10-30 DIAGNOSIS — K589 Irritable bowel syndrome without diarrhea: Secondary | ICD-10-CM | POA: Diagnosis not present

## 2018-10-30 DIAGNOSIS — Z0000 Encounter for general adult medical examination without abnormal findings: Secondary | ICD-10-CM | POA: Diagnosis not present

## 2018-10-30 DIAGNOSIS — E538 Deficiency of other specified B group vitamins: Secondary | ICD-10-CM | POA: Diagnosis not present

## 2018-10-30 DIAGNOSIS — Z13220 Encounter for screening for lipoid disorders: Secondary | ICD-10-CM | POA: Diagnosis not present

## 2018-10-30 DIAGNOSIS — E559 Vitamin D deficiency, unspecified: Secondary | ICD-10-CM | POA: Diagnosis not present

## 2018-10-30 DIAGNOSIS — E039 Hypothyroidism, unspecified: Secondary | ICD-10-CM | POA: Diagnosis not present

## 2018-11-07 DIAGNOSIS — E538 Deficiency of other specified B group vitamins: Secondary | ICD-10-CM | POA: Diagnosis not present

## 2018-12-05 DIAGNOSIS — R197 Diarrhea, unspecified: Secondary | ICD-10-CM | POA: Diagnosis not present

## 2018-12-09 DIAGNOSIS — E538 Deficiency of other specified B group vitamins: Secondary | ICD-10-CM | POA: Diagnosis not present

## 2019-01-10 DIAGNOSIS — E538 Deficiency of other specified B group vitamins: Secondary | ICD-10-CM | POA: Diagnosis not present

## 2019-01-14 DIAGNOSIS — Z1159 Encounter for screening for other viral diseases: Secondary | ICD-10-CM | POA: Diagnosis not present

## 2019-01-17 DIAGNOSIS — R197 Diarrhea, unspecified: Secondary | ICD-10-CM | POA: Diagnosis not present

## 2019-01-17 DIAGNOSIS — K529 Noninfective gastroenteritis and colitis, unspecified: Secondary | ICD-10-CM | POA: Diagnosis not present

## 2019-01-25 DIAGNOSIS — Z20828 Contact with and (suspected) exposure to other viral communicable diseases: Secondary | ICD-10-CM | POA: Diagnosis not present

## 2019-02-17 DIAGNOSIS — E039 Hypothyroidism, unspecified: Secondary | ICD-10-CM | POA: Diagnosis not present

## 2019-02-17 DIAGNOSIS — E538 Deficiency of other specified B group vitamins: Secondary | ICD-10-CM | POA: Diagnosis not present

## 2019-03-11 DIAGNOSIS — R197 Diarrhea, unspecified: Secondary | ICD-10-CM | POA: Diagnosis not present

## 2019-04-14 DIAGNOSIS — E538 Deficiency of other specified B group vitamins: Secondary | ICD-10-CM | POA: Diagnosis not present

## 2019-04-14 DIAGNOSIS — E039 Hypothyroidism, unspecified: Secondary | ICD-10-CM | POA: Diagnosis not present

## 2019-04-28 ENCOUNTER — Other Ambulatory Visit: Payer: Self-pay | Admitting: Obstetrics and Gynecology

## 2019-04-28 DIAGNOSIS — Z681 Body mass index (BMI) 19 or less, adult: Secondary | ICD-10-CM | POA: Diagnosis not present

## 2019-04-28 DIAGNOSIS — N6452 Nipple discharge: Secondary | ICD-10-CM

## 2019-04-28 DIAGNOSIS — Z124 Encounter for screening for malignant neoplasm of cervix: Secondary | ICD-10-CM | POA: Diagnosis not present

## 2019-04-28 DIAGNOSIS — Z01419 Encounter for gynecological examination (general) (routine) without abnormal findings: Secondary | ICD-10-CM | POA: Diagnosis not present

## 2019-05-14 ENCOUNTER — Ambulatory Visit
Admission: RE | Admit: 2019-05-14 | Discharge: 2019-05-14 | Disposition: A | Discharge status: Home / Self Care | Payer: BLUE CROSS/BLUE SHIELD | Source: Ambulatory Visit | Attending: Obstetrics and Gynecology | Admitting: Obstetrics and Gynecology

## 2019-05-14 ENCOUNTER — Ambulatory Visit
Admission: RE | Admit: 2019-05-14 | Discharge: 2019-05-14 | Disposition: A | Discharge status: Home / Self Care | Payer: BC Managed Care – PPO | Source: Ambulatory Visit | Attending: Obstetrics and Gynecology | Admitting: Obstetrics and Gynecology

## 2019-05-14 ENCOUNTER — Other Ambulatory Visit: Payer: Self-pay

## 2019-05-14 ENCOUNTER — Other Ambulatory Visit: Payer: Self-pay | Admitting: Obstetrics and Gynecology

## 2019-05-14 DIAGNOSIS — N6452 Nipple discharge: Secondary | ICD-10-CM

## 2019-05-14 DIAGNOSIS — N6312 Unspecified lump in the right breast, upper inner quadrant: Secondary | ICD-10-CM | POA: Diagnosis not present

## 2019-05-14 DIAGNOSIS — N6311 Unspecified lump in the right breast, upper outer quadrant: Secondary | ICD-10-CM | POA: Diagnosis not present

## 2019-05-14 DIAGNOSIS — N6310 Unspecified lump in the right breast, unspecified quadrant: Secondary | ICD-10-CM

## 2019-05-14 DIAGNOSIS — R922 Inconclusive mammogram: Secondary | ICD-10-CM | POA: Diagnosis not present

## 2019-05-23 ENCOUNTER — Other Ambulatory Visit: Payer: BC Managed Care – PPO

## 2019-06-05 ENCOUNTER — Other Ambulatory Visit: Payer: Self-pay

## 2019-06-05 ENCOUNTER — Ambulatory Visit
Admission: RE | Admit: 2019-06-05 | Discharge: 2019-06-05 | Disposition: A | Discharge status: Home / Self Care | Payer: BC Managed Care – PPO | Source: Ambulatory Visit | Attending: Obstetrics and Gynecology | Admitting: Obstetrics and Gynecology

## 2019-06-05 DIAGNOSIS — N6310 Unspecified lump in the right breast, unspecified quadrant: Secondary | ICD-10-CM

## 2019-06-05 DIAGNOSIS — N6452 Nipple discharge: Secondary | ICD-10-CM

## 2019-06-05 DIAGNOSIS — N6341 Unspecified lump in right breast, subareolar: Secondary | ICD-10-CM | POA: Diagnosis not present

## 2019-06-05 DIAGNOSIS — D241 Benign neoplasm of right breast: Secondary | ICD-10-CM | POA: Diagnosis not present

## 2019-06-13 DIAGNOSIS — E538 Deficiency of other specified B group vitamins: Secondary | ICD-10-CM | POA: Diagnosis not present

## 2019-06-27 DIAGNOSIS — Z03818 Encounter for observation for suspected exposure to other biological agents ruled out: Secondary | ICD-10-CM | POA: Diagnosis not present

## 2019-06-27 DIAGNOSIS — Z20828 Contact with and (suspected) exposure to other viral communicable diseases: Secondary | ICD-10-CM | POA: Diagnosis not present

## 2019-06-29 DIAGNOSIS — Z20828 Contact with and (suspected) exposure to other viral communicable diseases: Secondary | ICD-10-CM | POA: Diagnosis not present

## 2019-06-29 DIAGNOSIS — Z03818 Encounter for observation for suspected exposure to other biological agents ruled out: Secondary | ICD-10-CM | POA: Diagnosis not present

## 2019-07-22 ENCOUNTER — Ambulatory Visit: Payer: Self-pay | Admitting: Surgery

## 2019-07-22 DIAGNOSIS — N6452 Nipple discharge: Secondary | ICD-10-CM | POA: Diagnosis not present

## 2019-07-22 DIAGNOSIS — N6012 Diffuse cystic mastopathy of left breast: Secondary | ICD-10-CM | POA: Diagnosis not present

## 2019-07-22 DIAGNOSIS — D241 Benign neoplasm of right breast: Secondary | ICD-10-CM

## 2019-07-22 DIAGNOSIS — N6011 Diffuse cystic mastopathy of right breast: Secondary | ICD-10-CM | POA: Diagnosis not present

## 2019-07-22 NOTE — H&P (Signed)
History of Present Illness Jacqueline Ochoa. Jacqueline Robeck MD; 07/22/2019 5:06 PM) The patient is a 49 year old female who presents with a breast mass. PCP - Dr. 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 Allyn Ochoa for right breast intraductal papilloma  This is a healthy 49 year old female who presents with a couple of months of right nipple discharge. Initially was bloody but now has become serous. She underwent workup including mammogram and ultrasound. This showed a 7 x 3 x 3 mm mass at 12:00 the retroareolar space. This was biopsied and revealed an intraductal papilloma. She does have 2 small adjacent cyst. Previously she had a large right breast cyst that was aspirated. She presents now to discuss excision of this area. Family history of breast cancer.  The patient was previously seen 8 years ago for some right lower quadrant abdominal pain. Incidental finding of one of the CT scan showed a 7 mm gallstone. Over the last several years the patient has had a lot of issues with diarrhea. She has undergone an extensive workup by [facility] and she is now on a strict diet. We will try to obtain those records. It does not sound like she had her gallbladder reevaluated recently.  CLINICAL DATA: 49 year old female presenting for evaluation of spontaneous right nipple discharge for about 1 month. She notices this mostly while sleeping. The color started off as bloody, then changed clear and then yellow.  EXAM: DIGITAL DIAGNOSTIC BILATERAL MAMMOGRAM WITH CAD AND TOMO  RIGHT BREAST ULTRASOUND  COMPARISON: Previous exam(s).  ACR Breast Density Category d: The breast tissue is extremely dense, which lowers the sensitivity of mammography.  FINDINGS: There is a large lobulated mass in the inferior retroareolar right breast, corresponding to the benign cyst seen on a 2018 ultrasound. No other suspicious calcifications, masses or areas of distortion are seen in the bilateral breasts.  Mammographic images were  processed with CAD.  Ultrasound of the retroareolar right breast at 12 o'clock demonstrates an isoechoic oval likely intraductal mass measuring 7 x 3 x 3 mm. Two larger benign-appearing cysts are seen in the right breast at 6 o'clock, 1 cm from the nipple. Ultrasound of the right axilla demonstrates multiple normal-appearing lymph nodes.  IMPRESSION: 1. There is a probable intraductal mass in the right breast at 12 o'clock. This may represent a papilloma.  2. No evidence of right axillary lymphadenopathy.  3. No suspicious findings in the left breast.  RECOMMENDATION: Ultrasound guided biopsy is recommended for the right breast and has been scheduled for 05/23/2019 at 1:45 p.m.  The patient is aware that if this biopsy does not give a cause for her discharge, breast MRI and surgical consultation would be recommended.  I have discussed the findings and recommendations with the patient. If applicable, a reminder letter will be sent to the patient regarding the next appointment.  BI-RADS CATEGORY 4: Suspicious.   Electronically Signed By: Jacqueline Ochoa JacquelineD. On: 05/14/2019 10:44  CLINICAL DATA: Biopsy of a possible right intraductal mass.  EXAM: ULTRASOUND GUIDED RIGHT BREAST CORE NEEDLE BIOPSY  COMPARISON: Previous exam(s).  PROCEDURE: I met with the patient and we discussed the procedure of ultrasound-guided biopsy, including benefits and alternatives. We discussed the high likelihood of a successful procedure. We discussed the risks of the procedure, including infection, bleeding, tissue injury, clip migration, and inadequate sampling. Informed written consent was given. The usual time-out protocol was performed immediately prior to the procedure.  Lesion quadrant: 12 o'clock retroareolar  Using sterile technique and 1% Lidocaine as  local anesthetic, under direct ultrasound visualization, a 12 gauge spring-loaded device was used to perform biopsy of a  possible right intraductal mass using a lateral approach. At the conclusion of the procedure tissue marker clip was deployed into the biopsy cavity. Follow up 2 view mammogram was performed and dictated separately.  IMPRESSION: Ultrasound guided biopsy of a possible right intraductal mass at 12 o'clock. No apparent complications.  Electronically Signed: By: Jacqueline Ochoa JacquelineD On: 06/05/2019 14:29    Problem List/Past Medical Jacqueline Ochoa. Jacqueline Monrreal, MD; 07/22/2019 5:06 PM) Jacqueline Ochoa PAPILLOMA OF BREAST, RIGHT (D24.1) DISCHARGE FROM RIGHT NIPPLE (N64.52) FIBROCYSTIC BREAST CHANGES, BILATERAL (N60.11, N60.12)  Past Surgical History Jacqueline Ochoa, RMA; 07/22/2019 3:24 PM) Breast Biopsy Right. Cesarean Section - 1 Oral Surgery Tonsillectomy  Diagnostic Studies History Jacqueline Ochoa, RMA; 07/22/2019 3:24 PM) Colonoscopy within last year Mammogram within last year Pap Smear 1-5 years ago  Allergies Jacqueline Ochoa, RMA; 07/22/2019 3:24 PM) Lipitor *ANTIHYPERLIPIDEMICS* Muscle Weakness Allergies Reconciled  Medication History (Jacqueline Ochoa, RMA; 07/22/2019 3:25 PM) Cytomel (5MCG Tablet, Oral) Active. Alaway (0.025% Solution, Ophthalmic) Active. Vitamin B1-B12 (100-1MG/ML Solution, Injection) Active. Synthroid (112MCG Tablet, Oral) Active. Vitamin D (50000U Capsule, Oral) Active. Probiotic (Oral) Active. Medications Reconciled  Social History Jacqueline Ochoa, RMA; 07/22/2019 3:24 PM) Alcohol use Occasional alcohol use. Caffeine use Tea. No drug use Tobacco use Never smoker.  Family History Jacqueline Ochoa, RMA; 07/22/2019 3:24 PM) Thyroid problems Mother.  Pregnancy / Birth History Jacqueline Bers [place], RMA; 07/22/2019 3:24 PM) Age at menarche 32 years. Contraceptive History Oral contraceptives. Gravida 2 Irregular periods Length (months) of breastfeeding 3-6 Maternal age 42-35 Para 2  Other Problems Jacqueline Ochoa. Jacqueline Stauffer, MD; 07/22/2019 5:06 PM) Hypercholesterolemia Lump In Breast Thyroid Disease Transfusion history     Review of Systems [employer] Ochoa RMA; 07/22/2019 3:24 PM) General Not Present- Appetite Loss, Chills, Fatigue, Fever, Night Sweats, Weight Gain and Weight Loss. Skin Not Present- Change in Wart/Mole, Dryness, Hives, Jaundice, New Lesions, Non-Healing Wounds, Rash and Ulcer. HEENT Not Present- Earache, Hearing Loss, Hoarseness, Nose Bleed, Oral Ulcers, Ringing in the Ears, Seasonal Allergies, Sinus Pain, Sore Throat, Visual Disturbances, Wears glasses/contact lenses and Yellow Eyes. Respiratory Not Present- Bloody sputum, Chronic Cough, Difficulty Breathing, Snoring and Wheezing. Breast Present- Breast Mass and Nipple Discharge. Not Present- Breast Pain and Skin Changes. Cardiovascular Not Present- Chest Pain, Difficulty Breathing Lying Down, Leg Cramps, Palpitations, Rapid Heart Rate, Shortness of Breath and Swelling of Extremities. Gastrointestinal Not Present- Abdominal Pain, Bloating, Bloody Stool, Change in Bowel Habits, Chronic diarrhea, Constipation, Difficulty Swallowing, Excessive gas, Gets full quickly at meals, Hemorrhoids, Indigestion, Nausea, Rectal Pain and Vomiting. Female Genitourinary Not Present- Frequency, Nocturia, Painful Urination, Pelvic Pain and Urgency. Musculoskeletal Not Present- Back Pain, Joint Pain, Joint Stiffness, Muscle Pain, Muscle Weakness and Swelling of Extremities. Neurological Not Present- Decreased Memory, Fainting, Headaches, Numbness, Seizures, Tingling, Tremor, Trouble walking and Weakness. Psychiatric Not Present- Anxiety, Bipolar, Change in Sleep Pattern, Depression, Fearful and Frequent crying. Endocrine Not Present- Cold Intolerance, Excessive Hunger, Hair Changes, Heat Intolerance, Hot flashes and New Diabetes. Hematology Not Present- Blood Thinners, Easy Bruising, Excessive bleeding, Gland problems, HIV and Persistent  Infections.  Vitals Jacqueline Bers Ochoa RMA; 07/22/2019 3:25 PM) 07/22/2019 3:25 PM Weight: 118.2 lb Height: 66in Body Surface Area: 1.6 m Body Mass Index: 19.08 kg/m  Temp.: 97.92F(Temporal)  Pulse: 92 (Regular)  P.OX: 100% (Room air) BP: 112/68(Sitting, Right Arm, Standard)        Physical Exam Rodman Key K. Koya Hunger MD; 07/22/2019 5:07 PM)  The  physical exam findings are as follows: Note:Constitutional: WDWN in NAD, conversant, no obvious deformities; resting comfortably Eyes: Pupils equal, round; sclera anicteric; moist conjunctiva; no lid lag HENT: Oral mucosa moist; good dentition Neck: No masses palpated, trachea midline; no thyromegaly Lungs: CTA bilaterally; normal respiratory effort Breasts: symmetric; no nipple retraction or discharge; no palpable masses; bilateral fibrocystic changes; no axillary lymphadenopathy CV: Regular rate and rhythm; no murmurs; extremities well-perfused with no edema Abd: +bowel sounds, soft, non-tender, no palpable organomegaly; no palpable hernias Musc: Normal gait; no apparent clubbing or cyanosis in extremities Lymphatic: No palpable cervical or axillary lymphadenopathy Skin: Warm, dry; no sign of jaundice Psychiatric - alert and oriented x 4; calm mood and affect    Assessment & Plan Rodman Key K. 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 The surgical procedure has been discussed with the patient. Potential risks, benefits, alternative treatments, and expected outcomes have been explained. All of the patient's questions at this time have been answered. The likelihood of reaching the patient's treatment goal is good. The patient understand the proposed surgical procedure and wishes to proceed. Note:We will review her  notes from [facility]. It is possible the patient needs further evaluation of her gallbladder and consideration of laparoscopic cholecystectomy. She would like to proceed with a breast surgery first.  Jacqueline Ochoa. Georgette Dover, MD, [facility] Surgery  General/ Trauma Surgery   07/22/2019 5:08 PM

## 2019-07-24 ENCOUNTER — Other Ambulatory Visit: Payer: Self-pay | Admitting: Surgery

## 2019-07-24 DIAGNOSIS — D241 Benign neoplasm of right breast: Secondary | ICD-10-CM

## 2019-08-15 DIAGNOSIS — E538 Deficiency of other specified B group vitamins: Secondary | ICD-10-CM | POA: Diagnosis not present

## 2019-08-27 ENCOUNTER — Encounter (HOSPITAL_BASED_OUTPATIENT_CLINIC_OR_DEPARTMENT_OTHER): Payer: Self-pay | Admitting: Surgery

## 2019-08-27 ENCOUNTER — Other Ambulatory Visit: Payer: Self-pay

## 2019-08-30 ENCOUNTER — Other Ambulatory Visit (HOSPITAL_COMMUNITY)
Admission: RE | Admit: 2019-08-30 | Discharge: 2019-08-30 | Disposition: A | Discharge status: Home / Self Care | Payer: BC Managed Care – PPO | Source: Ambulatory Visit | Attending: Surgery | Admitting: Surgery

## 2019-08-30 DIAGNOSIS — Z01812 Encounter for preprocedural laboratory examination: Secondary | ICD-10-CM | POA: Diagnosis not present

## 2019-08-30 DIAGNOSIS — Z20822 Contact with and (suspected) exposure to covid-19: Secondary | ICD-10-CM | POA: Diagnosis not present

## 2019-08-30 LAB — SARS CORONAVIRUS 2 (TAT 6-24 HRS): SARS Coronavirus 2: NEGATIVE

## 2019-09-02 ENCOUNTER — Ambulatory Visit
Admission: RE | Admit: 2019-09-02 | Discharge: 2019-09-02 | Disposition: A | Discharge status: Home / Self Care | Payer: BC Managed Care – PPO | Source: Ambulatory Visit | Attending: Surgery | Admitting: Surgery

## 2019-09-02 ENCOUNTER — Other Ambulatory Visit: Payer: Self-pay

## 2019-09-02 DIAGNOSIS — D241 Benign neoplasm of right breast: Secondary | ICD-10-CM | POA: Diagnosis not present

## 2019-09-02 NOTE — Progress Notes (Signed)

## 2019-09-03 ENCOUNTER — Other Ambulatory Visit: Payer: Self-pay

## 2019-09-03 ENCOUNTER — Ambulatory Visit (HOSPITAL_BASED_OUTPATIENT_CLINIC_OR_DEPARTMENT_OTHER)
Admission: RE | Admit: 2019-09-03 | Discharge: 2019-09-03 | Disposition: A | Discharge status: Home / Self Care | Payer: BC Managed Care – PPO | Attending: Surgery | Admitting: Surgery

## 2019-09-03 ENCOUNTER — Encounter (HOSPITAL_BASED_OUTPATIENT_CLINIC_OR_DEPARTMENT_OTHER)
Admission: RE | Disposition: A | Discharge status: Home / Self Care | Payer: Self-pay | Source: Home / Self Care | Attending: Surgery

## 2019-09-03 ENCOUNTER — Ambulatory Visit
Admission: RE | Admit: 2019-09-03 | Discharge: 2019-09-03 | Disposition: A | Discharge status: Home / Self Care | Payer: BC Managed Care – PPO | Source: Ambulatory Visit | Attending: Surgery | Admitting: Surgery

## 2019-09-03 ENCOUNTER — Ambulatory Visit (HOSPITAL_BASED_OUTPATIENT_CLINIC_OR_DEPARTMENT_OTHER): Payer: BC Managed Care – PPO | Admitting: Anesthesiology

## 2019-09-03 ENCOUNTER — Encounter (HOSPITAL_BASED_OUTPATIENT_CLINIC_OR_DEPARTMENT_OTHER): Payer: Self-pay | Admitting: Surgery

## 2019-09-03 DIAGNOSIS — N6021 Fibroadenosis of right breast: Secondary | ICD-10-CM | POA: Diagnosis not present

## 2019-09-03 DIAGNOSIS — Z803 Family history of malignant neoplasm of breast: Secondary | ICD-10-CM | POA: Diagnosis not present

## 2019-09-03 DIAGNOSIS — E7800 Pure hypercholesterolemia, unspecified: Secondary | ICD-10-CM | POA: Insufficient documentation

## 2019-09-03 DIAGNOSIS — D241 Benign neoplasm of right breast: Secondary | ICD-10-CM

## 2019-09-03 DIAGNOSIS — Z8349 Family history of other endocrine, nutritional and metabolic diseases: Secondary | ICD-10-CM | POA: Insufficient documentation

## 2019-09-03 DIAGNOSIS — Z79899 Other long term (current) drug therapy: Secondary | ICD-10-CM | POA: Diagnosis not present

## 2019-09-03 DIAGNOSIS — Z888 Allergy status to other drugs, medicaments and biological substances status: Secondary | ICD-10-CM | POA: Insufficient documentation

## 2019-09-03 DIAGNOSIS — N6091 Unspecified benign mammary dysplasia of right breast: Secondary | ICD-10-CM | POA: Diagnosis not present

## 2019-09-03 DIAGNOSIS — N6452 Nipple discharge: Secondary | ICD-10-CM | POA: Diagnosis not present

## 2019-09-03 DIAGNOSIS — E559 Vitamin D deficiency, unspecified: Secondary | ICD-10-CM | POA: Diagnosis not present

## 2019-09-03 DIAGNOSIS — N6011 Diffuse cystic mastopathy of right breast: Secondary | ICD-10-CM | POA: Diagnosis not present

## 2019-09-03 DIAGNOSIS — E785 Hyperlipidemia, unspecified: Secondary | ICD-10-CM | POA: Diagnosis not present

## 2019-09-03 DIAGNOSIS — E039 Hypothyroidism, unspecified: Secondary | ICD-10-CM | POA: Diagnosis not present

## 2019-09-03 HISTORY — DX: Other specified postprocedural states: Z98.890

## 2019-09-03 HISTORY — DX: Hypothyroidism, unspecified: E03.9

## 2019-09-03 HISTORY — DX: Other specified postprocedural states: R11.2

## 2019-09-03 HISTORY — PX: BREAST EXCISIONAL BIOPSY: SUR124

## 2019-09-03 HISTORY — PX: BREAST LUMPECTOMY WITH RADIOACTIVE SEED LOCALIZATION: SHX6424

## 2019-09-03 LAB — POCT PREGNANCY, URINE: Preg Test, Ur: NEGATIVE

## 2019-09-03 SURGERY — BREAST LUMPECTOMY WITH RADIOACTIVE SEED LOCALIZATION
Anesthesia: General | Site: Breast | Laterality: Right

## 2019-09-03 MED ORDER — PROPOFOL 500 MG/50ML IV EMUL
INTRAVENOUS | Status: DC | PRN
  Administered 2019-09-03: 140 ug/kg/min via INTRAVENOUS

## 2019-09-03 MED ORDER — CHLORHEXIDINE GLUCONATE CLOTH 2 % EX PADS: 6.0000 | MEDICATED_PAD | Freq: Once | CUTANEOUS | Status: DC

## 2019-09-03 MED ORDER — OXYCODONE HCL 5 MG/5ML PO SOLN: 5.0000 mg | Freq: Once | ORAL | Status: DC | PRN

## 2019-09-03 MED ORDER — CEFAZOLIN SODIUM-DEXTROSE 2-4 GM/100ML-% IV SOLN
INTRAVENOUS | Status: AC
  Filled 2019-09-03: qty 100

## 2019-09-03 MED ORDER — DEXAMETHASONE SODIUM PHOSPHATE 10 MG/ML IJ SOLN
INTRAMUSCULAR | Status: DC | PRN
  Administered 2019-09-03: 10 mg via INTRAVENOUS

## 2019-09-03 MED ORDER — GABAPENTIN 300 MG PO CAPS
ORAL_CAPSULE | ORAL | Status: AC
  Filled 2019-09-03: qty 1

## 2019-09-03 MED ORDER — ACETAMINOPHEN 500 MG PO TABS
ORAL_TABLET | ORAL | Status: AC
  Filled 2019-09-03: qty 2

## 2019-09-03 MED ORDER — DEXAMETHASONE SODIUM PHOSPHATE 4 MG/ML IJ SOLN: 4.0000 mg | INTRAMUSCULAR | Status: DC

## 2019-09-03 MED ORDER — SCOPOLAMINE 1 MG/3DAYS TD PT72
1.0000 | MEDICATED_PATCH | TRANSDERMAL | Status: DC
  Administered 2019-09-03: 1.5 mg via TRANSDERMAL

## 2019-09-03 MED ORDER — FENTANYL CITRATE (PF) 100 MCG/2ML IJ SOLN
INTRAMUSCULAR | Status: DC | PRN
  Administered 2019-09-03 (×2): 50 ug via INTRAVENOUS

## 2019-09-03 MED ORDER — BUPIVACAINE HCL (PF) 0.25 % IJ SOLN
INTRAMUSCULAR | Status: DC | PRN
  Administered 2019-09-03: 10 mL

## 2019-09-03 MED ORDER — MIDAZOLAM HCL 2 MG/2ML IJ SOLN
INTRAMUSCULAR | Status: AC
  Filled 2019-09-03: qty 2

## 2019-09-03 MED ORDER — LIDOCAINE 2% (20 MG/ML) 5 ML SYRINGE
INTRAMUSCULAR | Status: AC
  Filled 2019-09-03: qty 5

## 2019-09-03 MED ORDER — MIDAZOLAM HCL 2 MG/2ML IJ SOLN
INTRAMUSCULAR | Status: DC | PRN
  Administered 2019-09-03: 2 mg via INTRAVENOUS

## 2019-09-03 MED ORDER — BUPIVACAINE HCL (PF) 0.25 % IJ SOLN
INTRAMUSCULAR | Status: AC
  Filled 2019-09-03: qty 30

## 2019-09-03 MED ORDER — PROPOFOL 500 MG/50ML IV EMUL
INTRAVENOUS | Status: AC
  Filled 2019-09-03: qty 50

## 2019-09-03 MED ORDER — ONDANSETRON HCL 4 MG/2ML IJ SOLN: 4.0000 mg | Freq: Four times a day (QID) | INTRAMUSCULAR | Status: DC | PRN

## 2019-09-03 MED ORDER — GABAPENTIN 300 MG PO CAPS
300.0000 mg | ORAL_CAPSULE | ORAL | Status: AC
  Administered 2019-09-03: 300 mg via ORAL

## 2019-09-03 MED ORDER — OXYCODONE HCL 5 MG PO TABS: 5.0000 mg | ORAL_TABLET | Freq: Once | ORAL | Status: DC | PRN

## 2019-09-03 MED ORDER — KETOROLAC TROMETHAMINE 30 MG/ML IJ SOLN
INTRAMUSCULAR | Status: DC | PRN
Start: 2019-09-03 — End: 2019-09-03
  Administered 2019-09-03: 30 mg via INTRAVENOUS

## 2019-09-03 MED ORDER — FENTANYL CITRATE (PF) 100 MCG/2ML IJ SOLN
INTRAMUSCULAR | Status: AC
  Filled 2019-09-03: qty 2

## 2019-09-03 MED ORDER — TRAMADOL HCL 50 MG PO TABS: 50.0000 mg | ORAL_TABLET | Freq: Four times a day (QID) | ORAL | 0 refills | Status: AC | PRN

## 2019-09-03 MED ORDER — LIDOCAINE 2% (20 MG/ML) 5 ML SYRINGE
INTRAMUSCULAR | Status: DC | PRN
  Administered 2019-09-03: 60 mg via INTRAVENOUS

## 2019-09-03 MED ORDER — ONDANSETRON HCL 4 MG/2ML IJ SOLN
INTRAMUSCULAR | Status: DC | PRN
  Administered 2019-09-03: 4 mg via INTRAVENOUS

## 2019-09-03 MED ORDER — PROPOFOL 10 MG/ML IV BOLUS
INTRAVENOUS | Status: DC | PRN
  Administered 2019-09-03: 160 mg via INTRAVENOUS

## 2019-09-03 MED ORDER — SCOPOLAMINE 1 MG/3DAYS TD PT72
MEDICATED_PATCH | TRANSDERMAL | Status: AC
  Filled 2019-09-03: qty 1

## 2019-09-03 MED ORDER — FENTANYL CITRATE (PF) 100 MCG/2ML IJ SOLN: 25.0000 ug | INTRAMUSCULAR | Status: DC | PRN

## 2019-09-03 MED ORDER — CEFAZOLIN SODIUM-DEXTROSE 2-4 GM/100ML-% IV SOLN
2.0000 g | INTRAVENOUS | Status: AC
  Administered 2019-09-03: 2 g via INTRAVENOUS

## 2019-09-03 MED ORDER — ACETAMINOPHEN 500 MG PO TABS
1000.0000 mg | ORAL_TABLET | ORAL | Status: AC
  Administered 2019-09-03: 1000 mg via ORAL

## 2019-09-03 MED ORDER — LACTATED RINGERS IV SOLN: INTRAVENOUS | Status: DC

## 2019-09-03 SURGICAL SUPPLY — 57 items
APL PRP STRL LF DISP 70% ISPRP (MISCELLANEOUS) ×1
APL SKNCLS STERI-STRIP NONHPOA (GAUZE/BANDAGES/DRESSINGS) ×1
APPLIER CLIP 9.375 MED OPEN (MISCELLANEOUS) ×3
APR CLP MED 9.3 20 MLT OPN (MISCELLANEOUS) ×1
BENZOIN TINCTURE PRP APPL 2/3 (GAUZE/BANDAGES/DRESSINGS) ×3 IMPLANT
BLADE HEX COATED 2.75 (ELECTRODE) ×3 IMPLANT
BLADE SURG 15 STRL LF DISP TIS (BLADE) ×1 IMPLANT
BLADE SURG 15 STRL SS (BLADE) ×3
CANISTER SUC SOCK COL 7IN (MISCELLANEOUS) IMPLANT
CANISTER SUCT 1200ML W/VALVE (MISCELLANEOUS) IMPLANT
CHLORAPREP W/TINT 26 (MISCELLANEOUS) ×3 IMPLANT
CLIP APPLIE 9.375 MED OPEN (MISCELLANEOUS) ×1 IMPLANT
CLOSURE WOUND 1/2 X4 (GAUZE/BANDAGES/DRESSINGS) ×1
COVER BACK TABLE 60X90IN (DRAPES) ×3 IMPLANT
COVER MAYO STAND STRL (DRAPES) ×3 IMPLANT
COVER PROBE W GEL 5X96 (DRAPES) ×3 IMPLANT
COVER WAND RF STERILE (DRAPES) IMPLANT
DECANTER SPIKE VIAL GLASS SM (MISCELLANEOUS) IMPLANT
DRAPE LAPAROTOMY 100X72 PEDS (DRAPES) ×3 IMPLANT
DRAPE UTILITY XL STRL (DRAPES) ×3 IMPLANT
DRSG TEGADERM 4X4.75 (GAUZE/BANDAGES/DRESSINGS) ×3 IMPLANT
ELECT REM PT RETURN 9FT ADLT (ELECTROSURGICAL) ×3
ELECTRODE REM PT RTRN 9FT ADLT (ELECTROSURGICAL) ×1 IMPLANT
GAUZE SPONGE 4X4 12PLY STRL LF (GAUZE/BANDAGES/DRESSINGS) IMPLANT
GLOVE BIO SURGEON STRL SZ7 (GLOVE) ×3 IMPLANT
GLOVE BIOGEL PI IND STRL 7.0 (GLOVE) IMPLANT
GLOVE BIOGEL PI IND STRL 7.5 (GLOVE) ×1 IMPLANT
GLOVE BIOGEL PI INDICATOR 7.0 (GLOVE) ×4
GLOVE BIOGEL PI INDICATOR 7.5 (GLOVE) ×2
GLOVE ECLIPSE 6.5 STRL STRAW (GLOVE) ×2 IMPLANT
GOWN STRL REUS W/ TWL LRG LVL3 (GOWN DISPOSABLE) ×2 IMPLANT
GOWN STRL REUS W/TWL LRG LVL3 (GOWN DISPOSABLE) ×6
ILLUMINATOR WAVEGUIDE N/F (MISCELLANEOUS) IMPLANT
KIT MARKER MARGIN INK (KITS) ×3 IMPLANT
LIGHT WAVEGUIDE WIDE FLAT (MISCELLANEOUS) IMPLANT
NDL HYPO 25X1 1.5 SAFETY (NEEDLE) ×1 IMPLANT
NEEDLE HYPO 25X1 1.5 SAFETY (NEEDLE) ×3 IMPLANT
NS IRRIG 1000ML POUR BTL (IV SOLUTION) ×3 IMPLANT
PACK BASIN DAY SURGERY FS (CUSTOM PROCEDURE TRAY) ×3 IMPLANT
PENCIL SMOKE EVACUATOR (MISCELLANEOUS) ×3 IMPLANT
SLEEVE SCD COMPRESS KNEE MED (MISCELLANEOUS) ×3 IMPLANT
SPONGE GAUZE 2X2 8PLY STER LF (GAUZE/BANDAGES/DRESSINGS)
SPONGE GAUZE 2X2 8PLY STRL LF (GAUZE/BANDAGES/DRESSINGS) IMPLANT
SPONGE LAP 18X18 RF (DISPOSABLE) IMPLANT
SPONGE LAP 4X18 RFD (DISPOSABLE) ×3 IMPLANT
STRIP CLOSURE SKIN 1/2X4 (GAUZE/BANDAGES/DRESSINGS) ×2 IMPLANT
SUT MON AB 4-0 PC3 18 (SUTURE) ×3 IMPLANT
SUT SILK 2 0 SH (SUTURE) IMPLANT
SUT VIC AB 3-0 SH 27 (SUTURE) ×3
SUT VIC AB 3-0 SH 27X BRD (SUTURE) ×1 IMPLANT
SYR BULB EAR ULCER 3OZ GRN STR (SYRINGE) IMPLANT
SYR CONTROL 10ML LL (SYRINGE) ×3 IMPLANT
TOWEL GREEN STERILE FF (TOWEL DISPOSABLE) ×3 IMPLANT
TRAY FAXITRON CT DISP (TRAY / TRAY PROCEDURE) ×3 IMPLANT
TUBE CONNECTING 20'X1/4 (TUBING)
TUBE CONNECTING 20X1/4 (TUBING) IMPLANT
YANKAUER SUCT BULB TIP NO VENT (SUCTIONS) IMPLANT

## 2019-09-03 NOTE — Discharge Instructions (Signed)
[place] Office Phone Number [phone]  BREAST BIOPSY/ PARTIAL MASTECTOMY: POST OP INSTRUCTIONS  Always review your discharge instruction sheet given to you by the facility where your surgery was performed.  IF YOU HAVE DISABILITY OR FAMILY LEAVE FORMS, YOU MUST BRING THEM TO THE OFFICE FOR PROCESSING.  DO NOT GIVE THEM TO YOUR DOCTOR.  1. A prescription for pain medication may be given to you upon discharge.  Take your pain medication as prescribed, if needed.  If narcotic pain medicine is not needed, then you may take acetaminophen (Tylenol) or ibuprofen (Advil) as needed. 2. Take your usually prescribed medications unless otherwise directed 3. If you need a refill on your pain medication, please contact your pharmacy.  They will contact our office to request authorization.  Prescriptions will not be filled after 5pm or on week-ends. 4. You should eat very light the first 24 hours after surgery, such as soup, crackers, pudding, etc.  Resume your normal diet the day after surgery. 5. Most patients will experience some swelling and bruising in the breast.  Ice packs and a good support bra will help.  Swelling and bruising can take several days to resolve.  6. It is common to experience some constipation if taking pain medication after surgery.  Increasing fluid intake and taking a stool softener will usually help or prevent this problem from occurring.  A mild laxative (Milk of Magnesia or Miralax) should be taken according to package directions if there are no bowel movements after 48 hours. 7. Unless discharge instructions indicate otherwise, you may remove your bandages 24-48 hours after surgery, and you may shower at that time.  You may have steri-strips (small skin tapes) in place directly over the incision.  These strips should be left on the skin for 7-10 days.  If your surgeon used skin glue on the incision, you may shower in 24 hours.  The glue will flake off over the  next 2-3 weeks.  Any sutures or staples will be removed at the office during your follow-up visit. 8. ACTIVITIES:  You may resume regular daily activities (gradually increasing) beginning the next day.  Wearing a good support bra or sports bra minimizes pain and swelling.  You may have sexual intercourse when it is comfortable. a. You may drive when you no longer are taking prescription pain medication, you can comfortably wear a seatbelt, and you can safely maneuver your car and apply brakes. b. RETURN TO WORK:  ______________________________________________________________________________________ 9. You should see your doctor in the office for a follow-up appointment approximately two weeks after your surgery.  Your doctor's nurse will typically make your follow-up appointment when she calls you with your pathology report.  Expect your pathology report 2-3 business days after your surgery.  You may call to check if you do not hear from [place] after three days. 10. OTHER INSTRUCTIONS: _______________________________________________________________________________________________ _____________________________________________________________________________________________________________________________________ _____________________________________________________________________________________________________________________________________ _____________________________________________________________________________________________________________________________________  WHEN TO CALL YOUR DOCTOR: 1. Fever over 101.0 2. Nausea and/or vomiting. 3. Extreme swelling or bruising. 4. Continued bleeding from incision. 5. Increased pain, redness, or drainage from the incision.  The clinic staff is available to answer your questions during regular business hours.  Please don't hesitate to call and ask to speak to one of the nurses for clinical concerns.  If you have a medical emergency, go to the nearest  emergency room or call 911.  A surgeon from [facility] is always on call at the hospital.  For further questions, please visit [email or web address]  No Tylenol before 1:00pm if needed. No ibuprofen before 5:00pm if needed.  Post Anesthesia Home Care Instructions  Activity: Get plenty of rest for the remainder of the day. A responsible individual must stay with you for 24 hours following the procedure.  For the next 24 hours, DO NOT: -Drive a car -Paediatric nurse -Drink alcoholic beverages -Take any medication unless instructed by your physician -Make any legal decisions or sign important papers.  Meals: Start with liquid foods such as gelatin or soup. Progress to regular foods as tolerated. Avoid greasy, spicy, heavy foods. If nausea and/or vomiting occur, drink only clear liquids until the nausea and/or vomiting subsides. Call your physician if vomiting continues.  Special Instructions/Symptoms: Your throat may feel dry or sore from the anesthesia or the breathing tube placed in your throat during surgery. If this causes discomfort, gargle with warm salt water. The discomfort should disappear within 24 hours.  If you had a scopolamine patch placed behind your ear for the management of post- operative nausea and/or vomiting:  1. The medication in the patch is effective for 72 hours, after which it should be removed.  Wrap patch in a tissue and discard in the trash. Wash hands thoroughly with soap and water. 2. You may remove the patch earlier than 72 hours if you experience unpleasant side effects which may include dry mouth, dizziness or visual disturbances. 3. Avoid touching the patch. Wash your hands with soap and water after contact with the patch.

## 2019-09-03 NOTE — Op Note (Signed)
Pre-op Diagnosis:  Right intraductal papilloma Post-op Diagnosis: same Procedure:  Right radioactive seed localized lumpectomy Surgeon:  Sherina Stammer K. Anesthesia:  GEN - LMA Indications:  This is a healthy 49 year old female who presents with a couple of months of right nipple discharge. Initially was bloody but now has become serous. She underwent workup including mammogram and ultrasound. This showed a 7 x 3 x 3 mm mass at 12:00 the retroareolar space. This was biopsied and revealed an intraductal papilloma. She does have 2 small adjacent cyst. Previously she had a large right breast cyst that was aspirated. She presents now to discuss excision of this area. Family history of breast cancer.  Description of procedure: The patient is brought to the operating room placed in supine position on the operating room table. After an adequate level of general anesthesia was obtained, her right breast was prepped with ChloraPrep and draped in sterile fashion. A timeout was taken to ensure the proper patient and proper procedure. We interrogated the breast with the neoprobe. We made a circumareolar incision around the lateral side of the nipple after infiltrating with 0.25% Marcaine. Dissection was carried down in the breast tissue with cautery. We dissected behind the nipple.  We encountered a couple of cysts.  We used the neoprobe to guide [place] towards the radioactive seed. We excised an area of tissue around the radioactive seed 1.5 cm in diameter. The specimen was removed and was oriented with a paint kit. Specimen mammogram showed the radioactive seed within the specimen, but could not identify the biopsy clip.  We excised additional medial and lateral margins, but still could not identify the clip.  I decided not to continue blindly excising additional breast tissue for a benign diagnosis.  The seed or the clip may have migrated slightly.   These three specimens were sent for pathologic examination.  There is no residual radioactivity within the biopsy cavity. We inspected carefully for hemostasis. The wound was thoroughly irrigated. The wound was closed with a deep layer of 3-0 Vicryl and a subcuticular layer of 4-0 Monocryl. Benzoin Steri-Strips were applied. The patient was then extubated and brought to the recovery room in stable condition. All sponge, instrument, and needle counts are correct.  Imogene Burn. Georgette Dover, MD, [facility]  General/ Trauma Surgery  09/03/2019 9:10 AM

## 2019-09-03 NOTE — H&P (Signed)
History of Present Illness  The patient is a 49 year old female who presents with a breast mass. PCP - Dr. Briscoe Deutscher Referred by Dr. [name] for right breast intraductal papilloma  This is a healthy 49 year old female who presents with a couple of months of right nipple discharge. Initially was bloody but now has become serous. She underwent workup including mammogram and ultrasound. This showed a 7 x 3 x 3 mm mass at 12:00 the retroareolar space. This was biopsied and revealed an intraductal papilloma. She does have 2 small adjacent cyst. Previously she had a large right breast cyst that was aspirated. She presents now to discuss excision of this area. Family history of breast cancer.  The patient was previously seen 8 years ago for some right lower quadrant abdominal pain. Incidental finding of one of the CT scan showed a 7 mm gallstone. Over the last several years the patient has had a lot of issues with diarrhea. She has undergone an extensive workup by [facility] and she is now on a strict diet. We will try to obtain those records. It does not sound like she had her gallbladder reevaluated recently.  CLINICAL DATA: 49 year old female presenting for evaluation of spontaneous right nipple discharge for about 1 month. She notices this mostly while sleeping. The color started off as bloody, then changed clear and then yellow.  EXAM: DIGITAL DIAGNOSTIC BILATERAL MAMMOGRAM WITH CAD AND TOMO  RIGHT BREAST ULTRASOUND  COMPARISON: Previous exam(s).  ACR Breast Density Category d: The breast tissue is extremely dense, which lowers the sensitivity of mammography.  FINDINGS: There is a large lobulated mass in the inferior retroareolar right breast, corresponding to the benign cyst seen on a 2018 ultrasound. No other suspicious calcifications, masses or areas of distortion are seen in the bilateral breasts.  Mammographic images were processed with  CAD.  Ultrasound of the retroareolar right breast at 12 o'clock demonstrates an isoechoic oval likely intraductal mass measuring 7 x 3 x 3 mm. Two larger benign-appearing cysts are seen in the right breast at 6 o'clock, 1 cm from the nipple. Ultrasound of the right axilla demonstrates multiple normal-appearing lymph nodes.  IMPRESSION: 1. There is a probable intraductal mass in the right breast at 12 o'clock. This may represent a papilloma.  2. No evidence of right axillary lymphadenopathy.  3. No suspicious findings in the left breast.  RECOMMENDATION: Ultrasound guided biopsy is recommended for the right breast and has been scheduled for 05/23/2019 at 1:45 p.m.  The patient is aware that if this biopsy does not give a cause for her discharge, breast MRI and surgical consultation would be recommended.  I have discussed the findings and recommendations with the patient. If applicable, a reminder letter will be sent to the patient regarding the next appointment.  BI-RADS CATEGORY 4: Suspicious.   Electronically Signed By: [name] On: 05/14/2019 10:44  CLINICAL DATA: Biopsy of a possible right intraductal mass.  EXAM: ULTRASOUND GUIDED RIGHT BREAST CORE NEEDLE BIOPSY  COMPARISON: Previous exam(s).  PROCEDURE: I met with the patient and we discussed the procedure of ultrasound-guided biopsy, including benefits and alternatives. We discussed the high likelihood of a successful procedure. We discussed the risks of the procedure, including infection, bleeding, tissue injury, clip migration, and inadequate sampling. Informed written consent was given. The usual time-out protocol was performed immediately prior to the procedure.  Lesion quadrant: 12 o'clock retroareolar  Using sterile technique and 1% Lidocaine as local anesthetic, under direct ultrasound visualization,  a 12 gauge spring-loaded device was used to perform biopsy of a  possible right intraductal mass using a lateral approach. At the conclusion of the procedure tissue marker clip was deployed into the biopsy cavity. Follow up 2 view mammogram was performed and dictated separately.  IMPRESSION: Ultrasound guided biopsy of a possible right intraductal mass at 12 o'clock. No apparent complications.  Electronically Signed: By: David Williams III M.D On: 06/05/2019 14:29    Problem List/Past Medical  INTRADUCTAL PAPILLOMA OF BREAST, RIGHT (D24.1) DISCHARGE FROM RIGHT NIPPLE (N64.52) FIBROCYSTIC BREAST CHANGES, BILATERAL (N60.11, N60.12)  Past Surgical History  Breast Biopsy Right. Cesarean Section - 1 Oral Surgery Tonsillectomy  Diagnostic Studies History  Colonoscopy within last year Mammogram within last year Pap Smear 1-5 years ago  Allergies  Lipitor *ANTIHYPERLIPIDEMICS* Muscle Weakness Allergies Reconciled  Medication History  Cytomel (5MCG Tablet, Oral) Active. Alaway (0.025% Solution, Ophthalmic) Active. Vitamin B1-B12 (100-1MG/ML Solution, Injection) Active. Synthroid (112MCG Tablet, Oral) Active. Vitamin D (50000U Capsule, Oral) Active. Probiotic (Oral) Active. Medications Reconciled  Social History Alcohol use Occasional alcohol use. Caffeine use Tea. No drug use Tobacco use Never smoker.  Family History  Thyroid problems Mother.  Pregnancy / Birth History Age at menarche 16 years. Contraceptive History Oral contraceptives. Gravida 2 Irregular periods Length (months) of breastfeeding 3-6 Maternal age 31-35 Para 2  Other Problems  Hypercholesterolemia Lump In Breast Thyroid Disease Transfusion history     Review of Systems General Not Present- Appetite Loss, Chills, Fatigue, Fever, Night Sweats, Weight Gain and Weight Loss. Skin Not Present- Change in Wart/Mole, Dryness, Hives, Jaundice, New Lesions, Non-Healing Wounds, Rash and Ulcer. HEENT Not  Present- Earache, Hearing Loss, Hoarseness, Nose Bleed, Oral Ulcers, Ringing in the Ears, Seasonal Allergies, Sinus Pain, Sore Throat, Visual Disturbances, Wears glasses/contact lenses and Yellow Eyes. Respiratory Not Present- Bloody sputum, Chronic Cough, Difficulty Breathing, Snoring and Wheezing. Breast Present- Breast Mass and Nipple Discharge. Not Present- Breast Pain and Skin Changes. Cardiovascular Not Present- Chest Pain, Difficulty Breathing Lying Down, Leg Cramps, Palpitations, Rapid Heart Rate, Shortness of Breath and Swelling of Extremities. Gastrointestinal Not Present- Abdominal Pain, Bloating, Bloody Stool, Change in Bowel Habits, Chronic diarrhea, Constipation, Difficulty Swallowing, Excessive gas, Gets full quickly at meals, Hemorrhoids, Indigestion, Nausea, Rectal Pain and Vomiting. Female Genitourinary Not Present- Frequency, Nocturia, Painful Urination, Pelvic Pain and Urgency. Musculoskeletal Not Present- Back Pain, Joint Pain, Joint Stiffness, Muscle Pain, Muscle Weakness and Swelling of Extremities. Neurological Not Present- Decreased Memory, Fainting, Headaches, Numbness, Seizures, Tingling, Tremor, Trouble walking and Weakness. Psychiatric Not Present- Anxiety, Bipolar, Change in Sleep Pattern, Depression, Fearful and Frequent crying. Endocrine Not Present- Cold Intolerance, Excessive Hunger, Hair Changes, Heat Intolerance, Hot flashes and New Diabetes. Hematology Not Present- Blood Thinners, Easy Bruising, Excessive bleeding, Gland problems, HIV and Persistent Infections.  Vitals  Weight: 118.2 lb Height: 66in Body Surface Area: 1.6 m Body Mass Index: 19.08 kg/m  Temp.: 97.8F(Temporal)  Pulse: 92 (Regular)  P.OX: 100% (Room air) BP: 112/68(Sitting, Right Arm, Standard)        Physical Exam  The physical exam findings are as follows: Note:Constitutional: WDWN in NAD, conversant, no obvious deformities; resting comfortably Eyes: Pupils  equal, round; sclera anicteric; moist conjunctiva; no lid lag HENT: Oral mucosa moist; good dentition Neck: No masses palpated, trachea midline; no thyromegaly Lungs: CTA bilaterally; normal respiratory effort Breasts: symmetric; no nipple retraction or discharge; no palpable masses; bilateral fibrocystic changes; no axillary lymphadenopathy CV: Regular rate and rhythm; no murmurs; extremities well-perfused with no edema Abd: +  bowel sounds, soft, non-tender, no palpable organomegaly; no palpable hernias Musc: Normal gait; no apparent clubbing or cyanosis in extremities Lymphatic: No palpable cervical or axillary lymphadenopathy Skin: Warm, dry; no sign of jaundice Psychiatric - alert and oriented x 4; calm mood and affect    Assessment & Plan   INTRADUCTAL PAPILLOMA OF BREAST, RIGHT (D24.1)   DISCHARGE FROM RIGHT NIPPLE (N64.52)   FIBROCYSTIC BREAST CHANGES, BILATERAL (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 The surgical procedure has been discussed with the patient. Potential risks, benefits, alternative treatments, and expected outcomes have been explained. All of the patient's questions at this time have been answered. The likelihood of reaching the patient's treatment goal is good. The patient understand the proposed surgical procedure and wishes to proceed.   Matthew K. Tsuei, MD, FACS [facility]  General/ Trauma Surgery   09/03/2019 8:05 AM   

## 2019-09-03 NOTE — Transfer of Care (Signed)
Immediate Anesthesia Transfer of Care Note  Patient: Jacqueline Ochoa  Procedure(s) Performed: RIGHT BREAST LUMPECTOMY WITH RADIOACTIVE SEED LOCALIZATION (Right Breast)  Patient Location: PACU  Anesthesia Type:General  Level of Consciousness: awake, alert  and drowsy  Airway & Oxygen Therapy: Patient Spontanous Breathing and Patient connected to face mask oxygen  Post-op Assessment: Report given to RN and Post -op Vital signs reviewed and stable  Post vital signs: Reviewed and stable  Last Vitals:  Vitals Value Taken Time  BP 114/67 09/03/19 0916  Temp    Pulse 93 09/03/19 0918  Resp 14 09/03/19 0918  SpO2 100 % 09/03/19 0918  Vitals shown include unvalidated device data.  Last Pain:  Vitals:   09/03/19 0656  PainSc: 0-No pain         Complications: No complications documented.

## 2019-09-03 NOTE — Anesthesia Preprocedure Evaluation (Signed)
Anesthesia Evaluation  Patient identified by MRN, date of birth, ID band Patient awake    Reviewed: Allergy & Precautions, H&P , NPO status , Patient's Chart, lab work & pertinent test results  History of Anesthesia Complications (+) PONV and history of anesthetic complications  Airway Mallampati: II   Neck ROM: full    Dental   Pulmonary neg pulmonary ROS,    breath sounds clear to auscultation       Cardiovascular negative cardio ROS   Rhythm:regular Rate:Normal     Neuro/Psych    GI/Hepatic   Endo/Other  Hypothyroidism   Renal/GU      Musculoskeletal   Abdominal   Peds  Hematology   Anesthesia Other Findings   Reproductive/Obstetrics Breast mass                             Anesthesia Physical Anesthesia Plan  ASA: II  Anesthesia Plan: General   Post-op Pain Management:    Induction: Intravenous  PONV Risk Score and Plan: 4 or greater and Ondansetron, Dexamethasone, Midazolam, Scopolamine patch - Pre-op and Treatment may vary due to age or medical condition  Airway Management Planned: LMA  Additional Equipment:   Intra-op Plan:   Post-operative Plan: Extubation in OR  Informed Consent: I have reviewed the patients History and Physical, chart, labs and discussed the procedure including the risks, benefits and alternatives for the proposed anesthesia with the patient or authorized representative who has indicated his/her understanding and acceptance.       Plan Discussed with: CRNA, Anesthesiologist and Surgeon  Anesthesia Plan Comments:         Anesthesia Quick Evaluation

## 2019-09-03 NOTE — Anesthesia Procedure Notes (Signed)
Procedure Name: LMA Insertion Date/Time: 09/03/2019 8:19 AM Performed by: [place] Lania Zawistowski C, CRNA Pre-anesthesia Checklist: Patient identified, Emergency Drugs available, Suction available and Patient being monitored Patient Re-evaluated:Patient Re-evaluated prior to induction Oxygen Delivery Method: Circle system utilized Preoxygenation: Pre-oxygenation with 100% oxygen Induction Type: IV induction Ventilation: Mask ventilation without difficulty LMA: LMA inserted LMA Size: 4.0 Number of attempts: 1 Airway Equipment and Method: Bite block Placement Confirmation: positive ETCO2 Tube secured with: Tape Dental Injury: Teeth and Oropharynx as per pre-operative assessment

## 2019-09-03 NOTE — Anesthesia Postprocedure Evaluation (Signed)
Anesthesia Post Note  Patient: Jacqueline Ochoa  Procedure(s) Performed: RIGHT BREAST LUMPECTOMY WITH RADIOACTIVE SEED LOCALIZATION (Right Breast)     Patient location during evaluation: PACU Anesthesia Type: General Level of consciousness: awake and alert Pain management: pain level controlled Vital Signs Assessment: post-procedure vital signs reviewed and stable Respiratory status: spontaneous breathing, nonlabored ventilation, respiratory function stable and patient connected to nasal cannula oxygen Cardiovascular status: blood pressure returned to baseline and stable Postop Assessment: no apparent nausea or vomiting Anesthetic complications: no   No complications documented.  Last Vitals:  Vitals:   09/03/19 0944 09/03/19 1005  BP: 110/75 105/72  Pulse: 81 60  Resp: 15 18  Temp:  36.6 C  SpO2: 100% 100%    Last Pain:  Vitals:   09/03/19 1005  PainSc: 0-No pain                 Shalini Mair S

## 2019-09-04 ENCOUNTER — Encounter (HOSPITAL_BASED_OUTPATIENT_CLINIC_OR_DEPARTMENT_OTHER): Payer: Self-pay | Admitting: Surgery

## 2019-09-04 LAB — SURGICAL PATHOLOGY

## 2019-09-26 DIAGNOSIS — Z20822 Contact with and (suspected) exposure to covid-19: Secondary | ICD-10-CM | POA: Diagnosis not present

## 2019-11-18 DIAGNOSIS — Z131 Encounter for screening for diabetes mellitus: Secondary | ICD-10-CM | POA: Diagnosis not present

## 2019-11-18 DIAGNOSIS — E559 Vitamin D deficiency, unspecified: Secondary | ICD-10-CM | POA: Diagnosis not present

## 2019-11-18 DIAGNOSIS — E538 Deficiency of other specified B group vitamins: Secondary | ICD-10-CM | POA: Diagnosis not present

## 2019-11-18 DIAGNOSIS — E039 Hypothyroidism, unspecified: Secondary | ICD-10-CM | POA: Diagnosis not present

## 2019-11-18 DIAGNOSIS — E7800 Pure hypercholesterolemia, unspecified: Secondary | ICD-10-CM | POA: Diagnosis not present

## 2019-11-18 DIAGNOSIS — Z0000 Encounter for general adult medical examination without abnormal findings: Secondary | ICD-10-CM | POA: Diagnosis not present

## 2019-12-06 DIAGNOSIS — Z20822 Contact with and (suspected) exposure to covid-19: Secondary | ICD-10-CM | POA: Diagnosis not present

## 2020-06-28 DIAGNOSIS — E538 Deficiency of other specified B group vitamins: Secondary | ICD-10-CM | POA: Diagnosis not present

## 2020-09-08 ENCOUNTER — Encounter (HOSPITAL_COMMUNITY): Payer: Self-pay

## 2020-12-29 DIAGNOSIS — Z131 Encounter for screening for diabetes mellitus: Secondary | ICD-10-CM | POA: Diagnosis not present

## 2020-12-29 DIAGNOSIS — Z13220 Encounter for screening for lipoid disorders: Secondary | ICD-10-CM | POA: Diagnosis not present

## 2020-12-29 DIAGNOSIS — E039 Hypothyroidism, unspecified: Secondary | ICD-10-CM | POA: Diagnosis not present

## 2020-12-29 DIAGNOSIS — E538 Deficiency of other specified B group vitamins: Secondary | ICD-10-CM | POA: Diagnosis not present

## 2021-01-13 DIAGNOSIS — Z0000 Encounter for general adult medical examination without abnormal findings: Secondary | ICD-10-CM | POA: Diagnosis not present

## 2021-01-13 DIAGNOSIS — Z131 Encounter for screening for diabetes mellitus: Secondary | ICD-10-CM | POA: Diagnosis not present

## 2021-01-13 DIAGNOSIS — Z13220 Encounter for screening for lipoid disorders: Secondary | ICD-10-CM | POA: Diagnosis not present

## 2021-01-13 DIAGNOSIS — E559 Vitamin D deficiency, unspecified: Secondary | ICD-10-CM | POA: Diagnosis not present

## 2021-01-13 DIAGNOSIS — E039 Hypothyroidism, unspecified: Secondary | ICD-10-CM | POA: Diagnosis not present

## 2021-02-09 DIAGNOSIS — E039 Hypothyroidism, unspecified: Secondary | ICD-10-CM | POA: Diagnosis not present

## 2021-04-08 DIAGNOSIS — N6310 Unspecified lump in the right breast, unspecified quadrant: Secondary | ICD-10-CM | POA: Diagnosis not present

## 2021-04-12 ENCOUNTER — Other Ambulatory Visit: Payer: Self-pay | Admitting: Obstetrics and Gynecology

## 2021-04-12 DIAGNOSIS — N6310 Unspecified lump in the right breast, unspecified quadrant: Secondary | ICD-10-CM

## 2021-04-12 DIAGNOSIS — E538 Deficiency of other specified B group vitamins: Secondary | ICD-10-CM | POA: Diagnosis not present

## 2021-04-12 DIAGNOSIS — D649 Anemia, unspecified: Secondary | ICD-10-CM | POA: Diagnosis not present

## 2021-04-20 DIAGNOSIS — Z713 Dietary counseling and surveillance: Secondary | ICD-10-CM | POA: Diagnosis not present

## 2021-04-28 DIAGNOSIS — Z01419 Encounter for gynecological examination (general) (routine) without abnormal findings: Secondary | ICD-10-CM | POA: Diagnosis not present

## 2021-04-28 DIAGNOSIS — Z124 Encounter for screening for malignant neoplasm of cervix: Secondary | ICD-10-CM | POA: Diagnosis not present

## 2021-05-03 DIAGNOSIS — E039 Hypothyroidism, unspecified: Secondary | ICD-10-CM | POA: Diagnosis not present

## 2021-05-09 ENCOUNTER — Ambulatory Visit
Admission: RE | Admit: 2021-05-09 | Discharge: 2021-05-09 | Disposition: A | Discharge status: Home / Self Care | Payer: BC Managed Care – PPO | Source: Ambulatory Visit | Attending: Obstetrics and Gynecology | Admitting: Obstetrics and Gynecology

## 2021-05-09 DIAGNOSIS — N6310 Unspecified lump in the right breast, unspecified quadrant: Secondary | ICD-10-CM

## 2021-05-09 DIAGNOSIS — R922 Inconclusive mammogram: Secondary | ICD-10-CM | POA: Diagnosis not present

## 2021-05-10 DIAGNOSIS — E559 Vitamin D deficiency, unspecified: Secondary | ICD-10-CM | POA: Diagnosis not present

## 2021-05-10 DIAGNOSIS — Z681 Body mass index (BMI) 19 or less, adult: Secondary | ICD-10-CM | POA: Diagnosis not present

## 2021-05-10 DIAGNOSIS — E782 Mixed hyperlipidemia: Secondary | ICD-10-CM | POA: Diagnosis not present

## 2021-05-10 DIAGNOSIS — E039 Hypothyroidism, unspecified: Secondary | ICD-10-CM | POA: Diagnosis not present

## 2021-05-25 DIAGNOSIS — Z713 Dietary counseling and surveillance: Secondary | ICD-10-CM | POA: Diagnosis not present

## 2021-06-16 ENCOUNTER — Other Ambulatory Visit (INDEPENDENT_AMBULATORY_CARE_PROVIDER_SITE_OTHER): Payer: Self-pay

## 2021-07-18 DIAGNOSIS — E039 Hypothyroidism, unspecified: Secondary | ICD-10-CM | POA: Diagnosis not present

## 2021-07-18 DIAGNOSIS — E559 Vitamin D deficiency, unspecified: Secondary | ICD-10-CM | POA: Diagnosis not present

## 2021-07-18 DIAGNOSIS — E611 Iron deficiency: Secondary | ICD-10-CM | POA: Diagnosis not present

## 2021-07-18 DIAGNOSIS — E538 Deficiency of other specified B group vitamins: Secondary | ICD-10-CM | POA: Diagnosis not present

## 2021-08-01 DIAGNOSIS — Z713 Dietary counseling and surveillance: Secondary | ICD-10-CM | POA: Diagnosis not present

## 2021-08-23 DIAGNOSIS — Z713 Dietary counseling and surveillance: Secondary | ICD-10-CM | POA: Diagnosis not present

## 2021-09-28 DIAGNOSIS — Z713 Dietary counseling and surveillance: Secondary | ICD-10-CM | POA: Diagnosis not present

## 2021-10-04 DIAGNOSIS — Z713 Dietary counseling and surveillance: Secondary | ICD-10-CM | POA: Diagnosis not present

## 2021-10-12 DIAGNOSIS — Z713 Dietary counseling and surveillance: Secondary | ICD-10-CM | POA: Diagnosis not present

## 2021-10-25 DIAGNOSIS — Z713 Dietary counseling and surveillance: Secondary | ICD-10-CM | POA: Diagnosis not present

## 2021-11-22 DIAGNOSIS — Z713 Dietary counseling and surveillance: Secondary | ICD-10-CM | POA: Diagnosis not present

## 2021-12-15 DIAGNOSIS — Z713 Dietary counseling and surveillance: Secondary | ICD-10-CM | POA: Diagnosis not present

## 2022-01-13 DIAGNOSIS — Z713 Dietary counseling and surveillance: Secondary | ICD-10-CM | POA: Diagnosis not present

## 2022-01-18 DIAGNOSIS — E611 Iron deficiency: Secondary | ICD-10-CM | POA: Diagnosis not present

## 2022-01-18 DIAGNOSIS — E538 Deficiency of other specified B group vitamins: Secondary | ICD-10-CM | POA: Diagnosis not present

## 2022-01-18 DIAGNOSIS — Z1159 Encounter for screening for other viral diseases: Secondary | ICD-10-CM | POA: Diagnosis not present

## 2022-01-18 DIAGNOSIS — E785 Hyperlipidemia, unspecified: Secondary | ICD-10-CM | POA: Diagnosis not present

## 2022-01-18 DIAGNOSIS — Z0000 Encounter for general adult medical examination without abnormal findings: Secondary | ICD-10-CM | POA: Diagnosis not present

## 2022-01-18 DIAGNOSIS — E039 Hypothyroidism, unspecified: Secondary | ICD-10-CM | POA: Diagnosis not present

## 2022-01-18 DIAGNOSIS — E559 Vitamin D deficiency, unspecified: Secondary | ICD-10-CM | POA: Diagnosis not present

## 2022-02-17 DIAGNOSIS — Z713 Dietary counseling and surveillance: Secondary | ICD-10-CM | POA: Diagnosis not present

## 2022-03-04 DIAGNOSIS — Z23 Encounter for immunization: Secondary | ICD-10-CM | POA: Diagnosis not present

## 2022-03-17 DIAGNOSIS — Z713 Dietary counseling and surveillance: Secondary | ICD-10-CM | POA: Diagnosis not present

## 2022-04-21 DIAGNOSIS — Z713 Dietary counseling and surveillance: Secondary | ICD-10-CM | POA: Diagnosis not present

## 2022-05-26 DIAGNOSIS — Z713 Dietary counseling and surveillance: Secondary | ICD-10-CM | POA: Diagnosis not present

## 2022-06-23 ENCOUNTER — Other Ambulatory Visit: Payer: Self-pay | Admitting: Obstetrics and Gynecology

## 2022-06-23 DIAGNOSIS — Z0000 Encounter for general adult medical examination without abnormal findings: Secondary | ICD-10-CM

## 2022-06-29 ENCOUNTER — Ambulatory Visit
Admission: RE | Admit: 2022-06-29 | Discharge: 2022-06-29 | Disposition: A | Discharge status: Home / Self Care | Payer: BC Managed Care – PPO | Source: Ambulatory Visit | Attending: Obstetrics and Gynecology | Admitting: Obstetrics and Gynecology

## 2022-06-29 DIAGNOSIS — Z0000 Encounter for general adult medical examination without abnormal findings: Secondary | ICD-10-CM

## 2022-06-29 DIAGNOSIS — Z1231 Encounter for screening mammogram for malignant neoplasm of breast: Secondary | ICD-10-CM | POA: Diagnosis not present

## 2022-07-12 DIAGNOSIS — Z713 Dietary counseling and surveillance: Secondary | ICD-10-CM | POA: Diagnosis not present

## 2022-07-21 DIAGNOSIS — E538 Deficiency of other specified B group vitamins: Secondary | ICD-10-CM | POA: Diagnosis not present

## 2022-07-21 DIAGNOSIS — R17 Unspecified jaundice: Secondary | ICD-10-CM | POA: Diagnosis not present

## 2022-07-21 DIAGNOSIS — E785 Hyperlipidemia, unspecified: Secondary | ICD-10-CM | POA: Diagnosis not present

## 2022-07-21 DIAGNOSIS — E039 Hypothyroidism, unspecified: Secondary | ICD-10-CM | POA: Diagnosis not present

## 2022-07-21 DIAGNOSIS — E559 Vitamin D deficiency, unspecified: Secondary | ICD-10-CM | POA: Diagnosis not present

## 2022-07-21 DIAGNOSIS — E611 Iron deficiency: Secondary | ICD-10-CM | POA: Diagnosis not present

## 2022-08-07 DIAGNOSIS — Z01419 Encounter for gynecological examination (general) (routine) without abnormal findings: Secondary | ICD-10-CM | POA: Diagnosis not present

## 2022-08-07 DIAGNOSIS — Z681 Body mass index (BMI) 19 or less, adult: Secondary | ICD-10-CM | POA: Diagnosis not present

## 2022-10-04 DIAGNOSIS — Z713 Dietary counseling and surveillance: Secondary | ICD-10-CM | POA: Diagnosis not present

## 2022-10-20 DIAGNOSIS — Z23 Encounter for immunization: Secondary | ICD-10-CM | POA: Diagnosis not present

## 2022-11-03 DIAGNOSIS — Z713 Dietary counseling and surveillance: Secondary | ICD-10-CM | POA: Diagnosis not present

## 2022-11-14 DIAGNOSIS — Z713 Dietary counseling and surveillance: Secondary | ICD-10-CM | POA: Diagnosis not present

## 2022-11-23 IMAGING — MG DIGITAL DIAGNOSTIC BILAT W/ TOMO W/ CAD
6 of 10 series · 6 of 30 positions shown · non-contrast
Comparison: Previous exam(s).

CLINICAL DATA: 51-year-old female status post recent right breast
excision for a papilloma presents with waxing and waning palpable
lump in the subareolar right breast. Her symptoms are improved
today.

EXAM:
DIGITAL DIAGNOSTIC BILATERAL MAMMOGRAM WITH TOMOSYNTHESIS AND CAD;
ULTRASOUND RIGHT BREAST LIMITED
TECHNIQUE: Bilateral digital diagnostic mammography and breast tomosynthesis
was performed. The images were evaluated with computer-aided
detection.; Targeted ultrasound examination of the right breast was
performed

[L CC synth-2D]
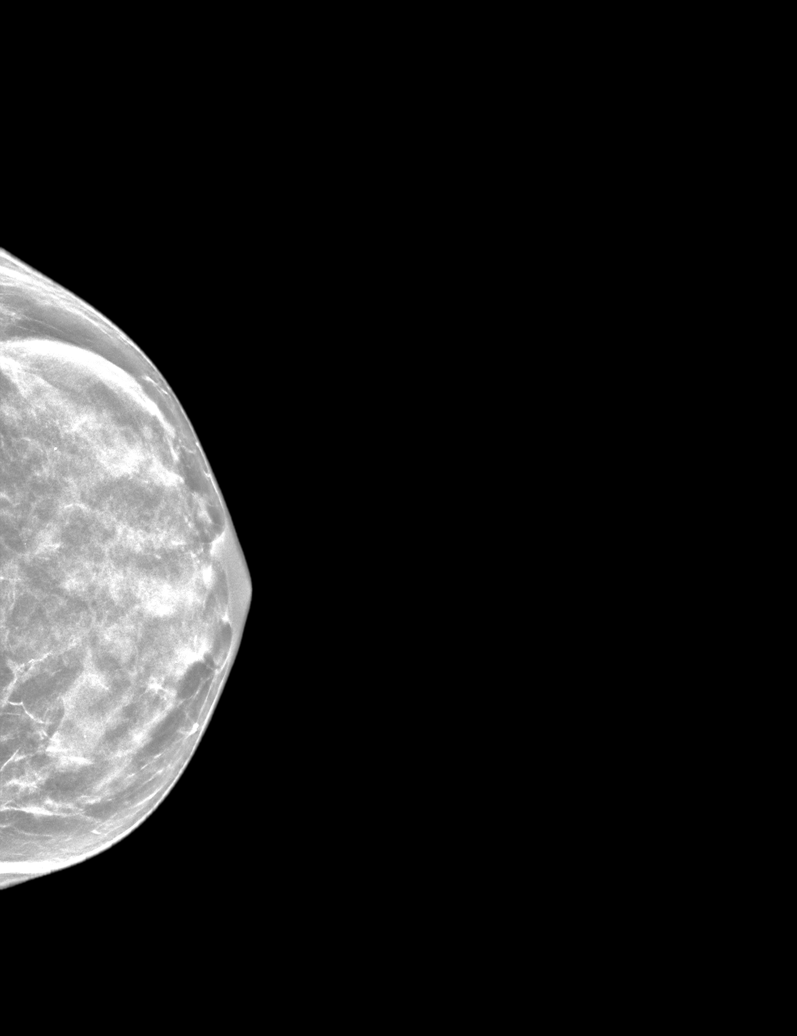

[L MLO synth-2D]
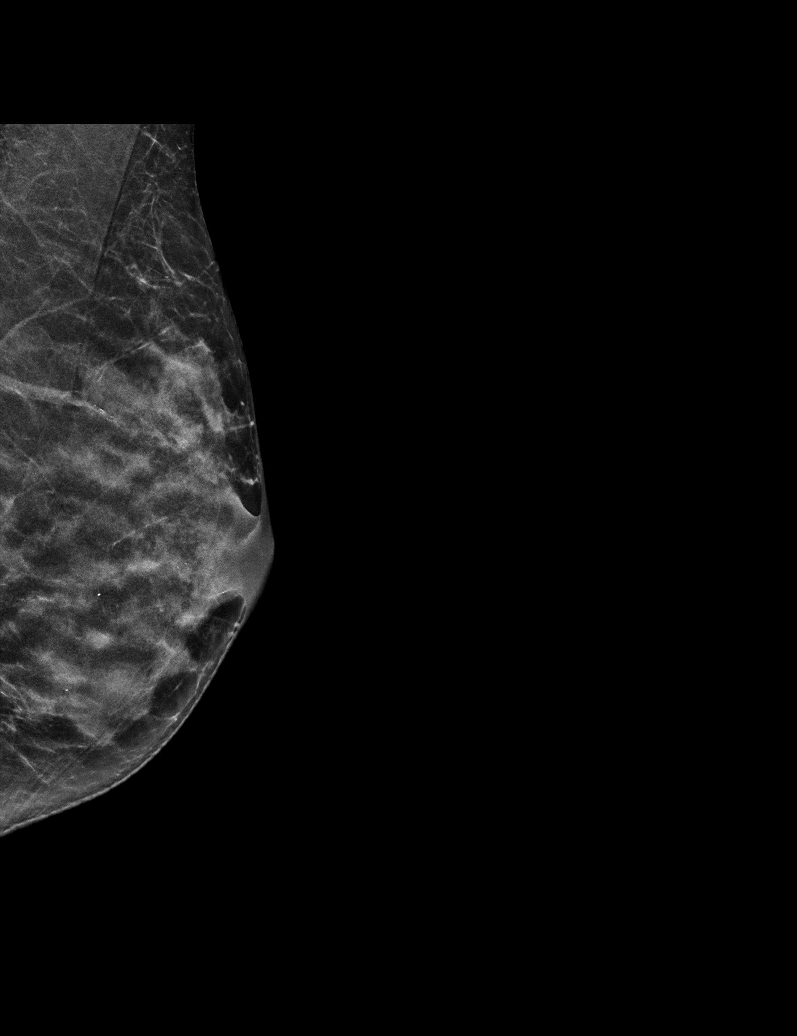

[R CC synth-2D]
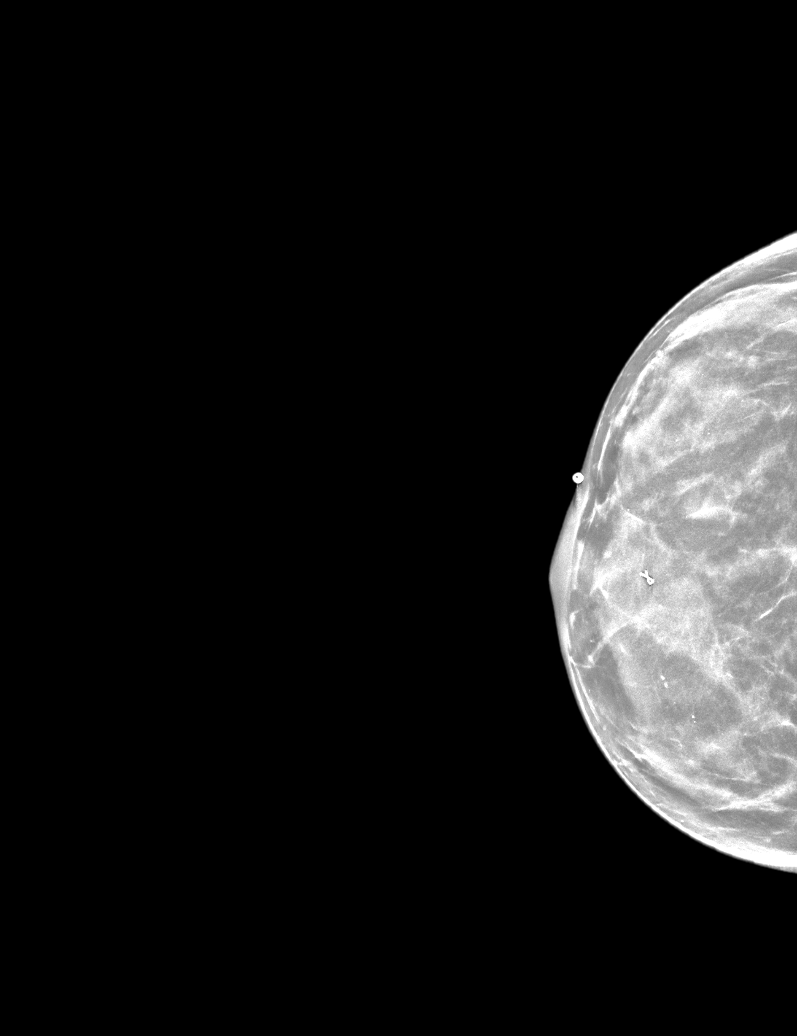

[R TAN synth-2D]
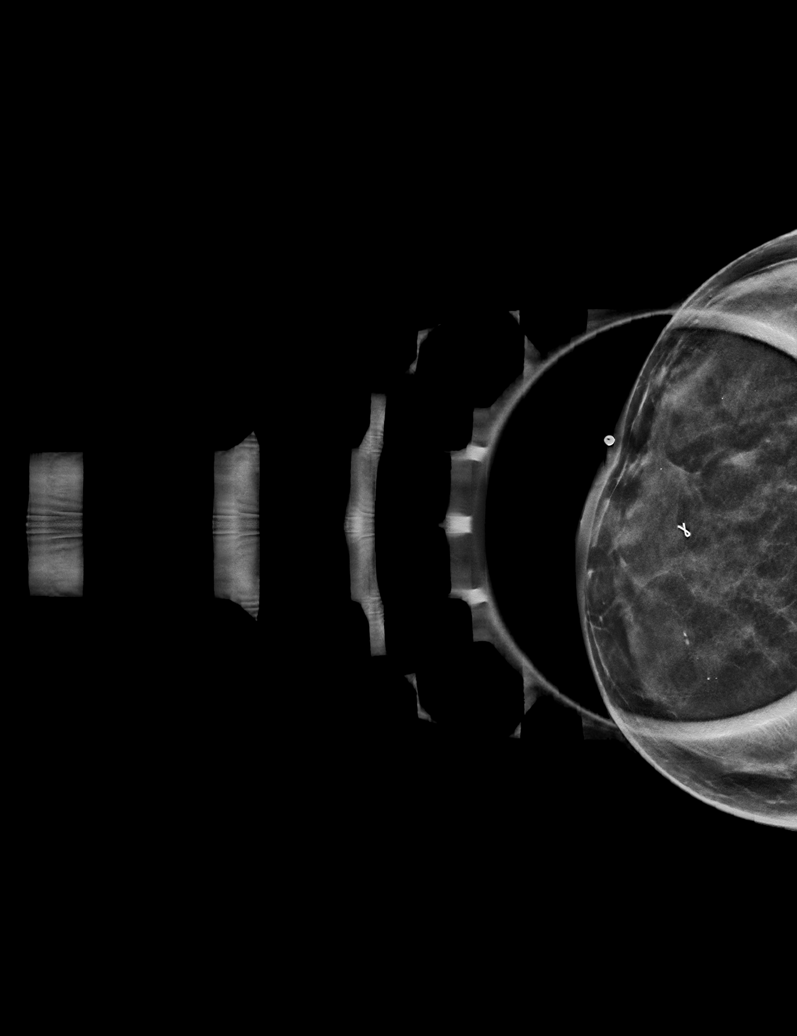

[R MLO synth-2D]
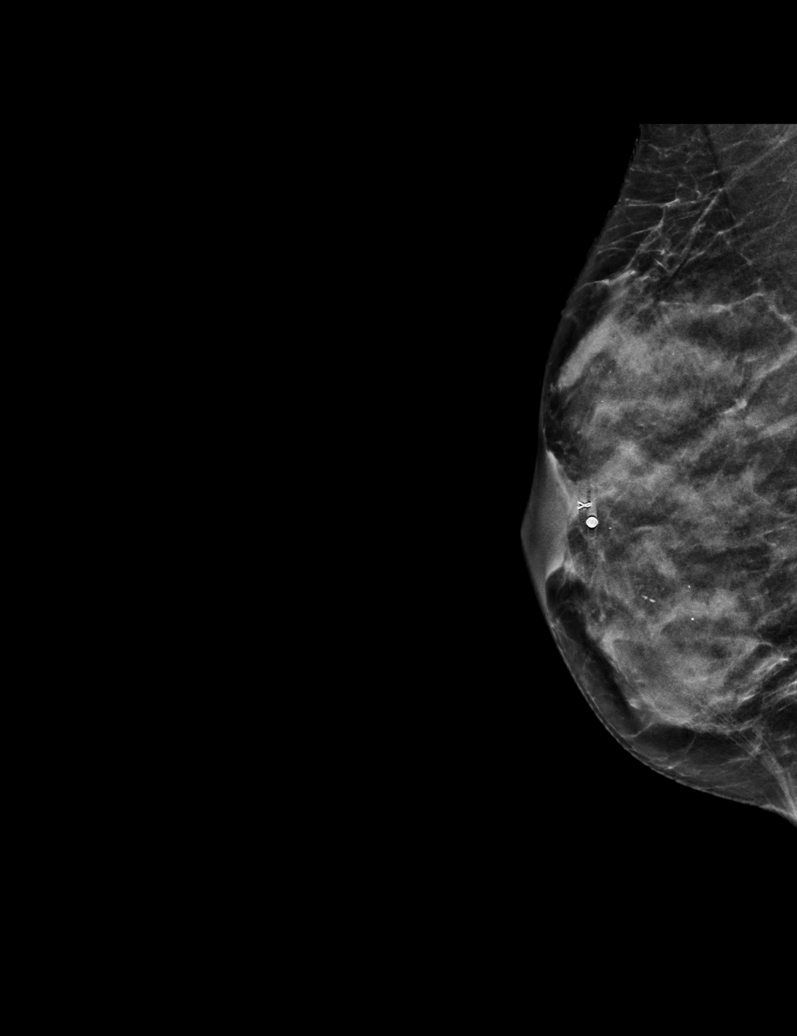

[L CC tomo · tomo slice 24/47.0]
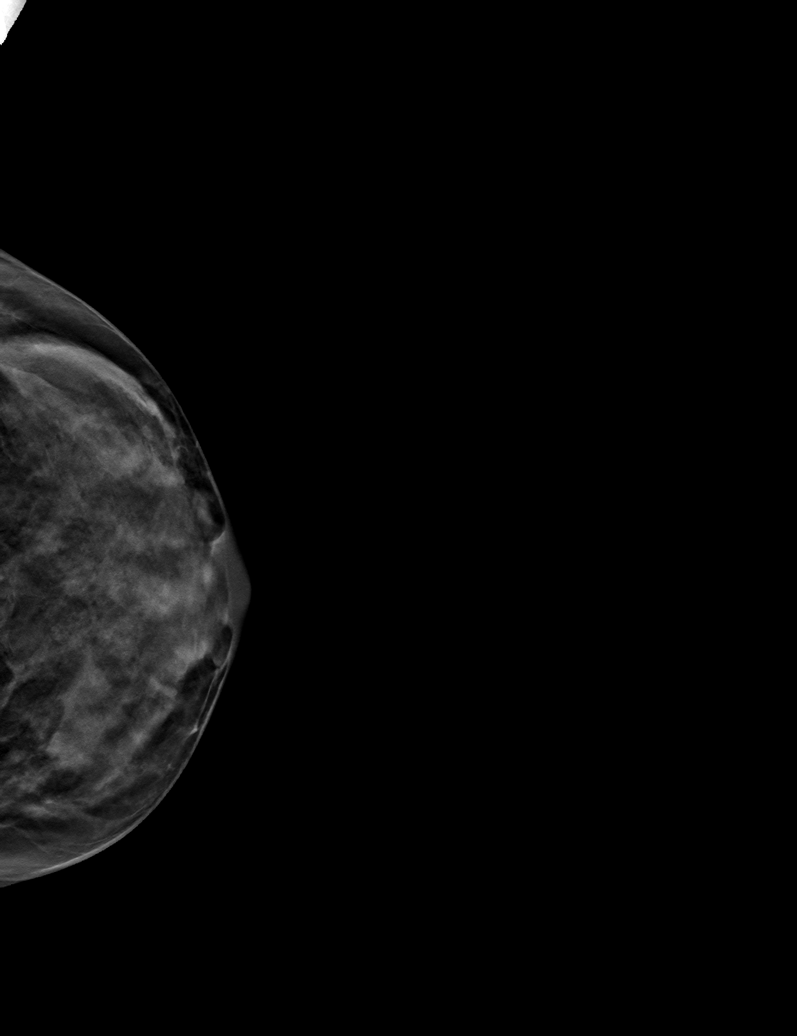

[6 of 30 positions shown; findings below may reference images not displayed]

ACR Breast Density Category d: The breast tissue is extremely dense,
which lowers the sensitivity of mammography.
FINDINGS: Radiopaque BB is placed at the site of the patient's focal symptoms
in the subareolar right breast. Post biopsy clip without additional
suspicious findings are seen in the subareolar right breast. No new
or suspicious findings in the remainder of either breast.

Targeted ultrasound is performed, showing no focal or suspicious
sonographic findings at the site of the patient's previous palpable
lump at the 8 o'clock retroareolar right breast. Post biopsy clip is
noted in the 12 o'clock region. No residual intraductal mass
identified on today's study.
IMPRESSION: 1. Right breast post biopsy and postsurgical changes without
suspicious mammographic or sonographic finding at the site of the
patient's focal symptoms. No residual intraductal masses identified
at the site of previously biopsied papilloma.
2. No mammographic evidence of malignancy on the left.

RECOMMENDATION:
1. Clinical follow-up recommended for the symptom at area of concern
in the right breast. Any further workup should be based on clinical
grounds.
2.  Screening mammogram in one year.(Code:[id number]-X-[id number])

I have discussed the findings and recommendations with the patient.
If applicable, a reminder letter will be sent to the patient
regarding the next appointment.

BI-RADS CATEGORY  2: Benign.

## 2022-12-05 DIAGNOSIS — Z713 Dietary counseling and surveillance: Secondary | ICD-10-CM | POA: Diagnosis not present

## 2023-01-24 DIAGNOSIS — E559 Vitamin D deficiency, unspecified: Secondary | ICD-10-CM | POA: Diagnosis not present

## 2023-01-24 DIAGNOSIS — Z23 Encounter for immunization: Secondary | ICD-10-CM | POA: Diagnosis not present

## 2023-01-24 DIAGNOSIS — E785 Hyperlipidemia, unspecified: Secondary | ICD-10-CM | POA: Diagnosis not present

## 2023-01-24 DIAGNOSIS — E039 Hypothyroidism, unspecified: Secondary | ICD-10-CM | POA: Diagnosis not present

## 2023-01-24 DIAGNOSIS — E611 Iron deficiency: Secondary | ICD-10-CM | POA: Diagnosis not present

## 2023-01-24 DIAGNOSIS — E538 Deficiency of other specified B group vitamins: Secondary | ICD-10-CM | POA: Diagnosis not present

## 2023-01-24 DIAGNOSIS — Z0000 Encounter for general adult medical examination without abnormal findings: Secondary | ICD-10-CM | POA: Diagnosis not present

## 2023-01-25 DIAGNOSIS — Z713 Dietary counseling and surveillance: Secondary | ICD-10-CM | POA: Diagnosis not present

## 2023-08-07 ENCOUNTER — Other Ambulatory Visit: Payer: Self-pay | Admitting: Obstetrics and Gynecology

## 2023-08-07 DIAGNOSIS — Z1231 Encounter for screening mammogram for malignant neoplasm of breast: Secondary | ICD-10-CM

## 2023-08-16 ENCOUNTER — Other Ambulatory Visit: Payer: Self-pay | Admitting: Medical Genetics

## 2023-08-22 ENCOUNTER — Ambulatory Visit
Admission: RE | Admit: 2023-08-22 | Discharge: 2023-08-22 | Disposition: A | Discharge status: Home / Self Care | Source: Ambulatory Visit | Attending: Obstetrics and Gynecology | Admitting: Obstetrics and Gynecology

## 2023-08-22 DIAGNOSIS — Z1231 Encounter for screening mammogram for malignant neoplasm of breast: Secondary | ICD-10-CM

## 2023-11-28 ENCOUNTER — Other Ambulatory Visit: Payer: Self-pay | Admitting: Medical Genetics

## 2023-11-28 DIAGNOSIS — Z006 Encounter for examination for normal comparison and control in clinical research program: Secondary | ICD-10-CM
